# Patient Record
Sex: Female | Born: 1985 | Race: White | Hispanic: No | Marital: Married | Smoking: Former smoker
Health system: Southern US, Community
[De-identification: ages and names within clinical notes are randomized; demographics above are authoritative.]

## PROBLEM LIST (undated history)

## (undated) DIAGNOSIS — G43909 Migraine, unspecified, not intractable, without status migrainosus: Secondary | ICD-10-CM

## (undated) HISTORY — PX: TUBAL LIGATION: SHX77

---
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
## 2003-04-29 ENCOUNTER — Emergency Department (HOSPITAL_COMMUNITY): Admission: EM | Admit: 2003-04-29 | Discharge: 2003-04-30 | Payer: Self-pay | Admitting: Emergency Medicine

## 2005-06-28 ENCOUNTER — Emergency Department (HOSPITAL_COMMUNITY): Admission: EM | Admit: 2005-06-28 | Discharge: 2005-06-28 | Payer: Self-pay | Admitting: Emergency Medicine

## 2006-02-28 ENCOUNTER — Emergency Department: Payer: Self-pay | Admitting: Emergency Medicine

## 2006-03-12 ENCOUNTER — Emergency Department: Payer: Self-pay | Admitting: Emergency Medicine

## 2015-04-30 ENCOUNTER — Emergency Department (HOSPITAL_COMMUNITY)
Admission: EM | Admit: 2015-04-30 | Discharge: 2015-04-30 | Disposition: A | Discharge status: Home / Self Care | Payer: Self-pay | Attending: Dermatology | Admitting: Dermatology

## 2015-04-30 ENCOUNTER — Encounter (HOSPITAL_COMMUNITY): Payer: Self-pay

## 2015-04-30 DIAGNOSIS — Z720 Tobacco use: Secondary | ICD-10-CM | POA: Insufficient documentation

## 2015-04-30 DIAGNOSIS — Z5321 Procedure and treatment not carried out due to patient leaving prior to being seen by health care provider: Secondary | ICD-10-CM | POA: Insufficient documentation

## 2015-04-30 DIAGNOSIS — R51 Headache: Secondary | ICD-10-CM | POA: Insufficient documentation

## 2015-04-30 NOTE — ED Notes (Signed)
Unable to locate pt in all waiting areas x 3 separate times.

## 2015-04-30 NOTE — ED Notes (Signed)
Pt was at work when she states she started getting a headache, then vomited x 1 and has been nauseated since then.

## 2015-12-26 ENCOUNTER — Emergency Department (HOSPITAL_COMMUNITY)
Admission: EM | Admit: 2015-12-26 | Discharge: 2015-12-26 | Disposition: A | Discharge status: Home / Self Care | Payer: Self-pay | Attending: Emergency Medicine | Admitting: Emergency Medicine

## 2015-12-26 ENCOUNTER — Encounter (HOSPITAL_COMMUNITY): Payer: Self-pay | Admitting: Emergency Medicine

## 2015-12-26 DIAGNOSIS — J069 Acute upper respiratory infection, unspecified: Secondary | ICD-10-CM | POA: Insufficient documentation

## 2015-12-26 DIAGNOSIS — J029 Acute pharyngitis, unspecified: Secondary | ICD-10-CM

## 2015-12-26 DIAGNOSIS — F17210 Nicotine dependence, cigarettes, uncomplicated: Secondary | ICD-10-CM | POA: Insufficient documentation

## 2015-12-26 LAB — RAPID STREP SCREEN (MED CTR MEBANE ONLY): STREPTOCOCCUS, GROUP A SCREEN (DIRECT): NEGATIVE

## 2015-12-26 NOTE — ED Triage Notes (Signed)
Pt reports sore throat and bilateral ear pain since last night.  Pt reports that all 5 of her kids have has strep throat in the last week.

## 2015-12-26 NOTE — ED Notes (Signed)
Pt left prior to dc instructions.

## 2015-12-26 NOTE — ED Provider Notes (Signed)
[facility]-EMERGENCY DEPT Provider Note   CSN: [id number] Arrival date & time: 12/26/15  [id number]  By signing my name below, I, Placido SouLogan Joldersma, attest that this documentation has been prepared under the direction and in the presence of Ivery QualeHobson Brylei Pedley, PA-C. Electronically Signed: Placido SouLogan Joldersma, ED Scribe. 12/26/15. 9:25 AM.   History   Chief Complaint Chief Complaint  Patient presents with  . Sore Throat    HPI HPI Comments: Shannon Turner is a 30 y.o. female who presents to the Emergency Department complaining of constant, moderate, throbbing, left sided sore throat onset last night. Pt states her five children have been dx with strep throat in the past week. She reports associated ear pain which she states began just prior to her sore throat. Her throat pain worsens with swallowing. Pt denies a h/o cardiac issues or rheumatic fever. She denies fever, chills, SOB, difficulty swallowing and rash.   The history is provided by the patient. No language interpreter was used.  Sore Throat  This is a new problem. The current episode started 12 to 24 hours ago. The problem occurs constantly. The problem has not changed since onset.Pertinent negatives include no shortness of breath. The symptoms are aggravated by swallowing. Nothing relieves the symptoms. She has tried nothing for the symptoms. The treatment provided no relief.    History reviewed. No pertinent past medical history.  There are no active problems to display for this patient.   Past Surgical History:  Procedure Laterality Date  . TUBAL LIGATION      OB History    No data available     Home Medications    Prior to Admission medications   Not on File    Family History History reviewed. No pertinent family history.  Social History Social History  Substance Use Topics  . Smoking status: Current Every Day Smoker    Packs/day: 1.00    Types: Cigarettes  . Smokeless tobacco: Not on file  . Alcohol use No      Allergies   Review of patient's allergies indicates no known allergies.   Review of Systems Review of Systems  Constitutional: Negative for chills and fever.  HENT: Positive for ear pain and sore throat. Negative for trouble swallowing.   Respiratory: Negative for shortness of breath.   Skin: Negative for color change and rash.  All other systems reviewed and are negative.  Physical Exam Updated Vital Signs BP 130/76 (BP Location: Left Arm)   Pulse 82   Temp 98.2 F (36.8 C) (Oral)   Resp 18   Ht 5\' 7"  (1.702 m)   Wt 178 lb (80.7 kg)   LMP 12/16/2015 (Exact Date)   SpO2 99%   BMI 27.88 kg/m   Physical Exam  Constitutional: She is oriented to person, place, and time. She appears well-developed and well-nourished.  HENT:  Head: Normocephalic and atraumatic.  Mouth/Throat: Uvula is midline. Posterior oropharyngeal edema (mild) present. No oropharyngeal exudate.  Uvula midline. Minimal swelling noted. No exudates. Airway patent.   Eyes: EOM are normal.  Neck: Normal range of motion. Neck supple.  Cardiovascular: Normal rate, regular rhythm, normal heart sounds and intact distal pulses.   No murmur heard. Pulmonary/Chest: Effort normal and breath sounds normal. No respiratory distress. She has no wheezes. She has no rales.  LCTA  Abdominal: Soft.  Musculoskeletal: Normal range of motion.  Neurological: She is alert and oriented to person, place, and time.  Skin: Skin is warm and dry. No rash noted.  No  rash  Psychiatric: She has a normal mood and affect.  Nursing note and vitals reviewed.  ED Treatments / Results  Labs (all labs ordered are listed, but only abnormal results are displayed) Labs Reviewed  RAPID STREP SCREEN (NOT AT [facility])  CULTURE, GROUP A STREP [facility])    EKG  EKG Interpretation None       Radiology No results found.  Procedures Procedures  DIAGNOSTIC STUDIES: Oxygen Saturation is 99% on RA, normal by my interpretation.     COORDINATION OF CARE: 9:23 AM Discussed next steps with pt. Pt verbalized understanding and is agreeable with the plan.    Medications Ordered in ED Medications - No data to display   Initial Impression / Assessment and Plan / ED Course  I have reviewed the triage vital signs and the nursing notes.  Pertinent labs & imaging results that were available during my care of the patient were reviewed by me and considered in my medical decision making (see chart for details).  Clinical Course    *I have reviewed nursing notes, vital signs, and all appropriate lab and imaging results for this patient.**  **I personally performed the services described in this documentation, which was scribed in my presence. The recorded information has been reviewed and is accurate.* Final Clinical Impressions(s) / ED Diagnoses  Strep screen is negative. Discussed the need for saltwater gargles, tylenol, and increasing fluids. Chloraseptic spray may also be helpful.   Final diagnoses:  Sore throat  Upper respiratory tract infection, unspecified type    New Prescriptions New Prescriptions   No medications on file     Ivery Quale, PA-C 12/30/15 1240    Nelva Nay, MD 12/31/15 [phone]

## 2015-12-26 NOTE — Discharge Instructions (Signed)
Your strep test is negative. Please use salt water gargles three times daily. Use tylenol or ibuprofen for pain or fever. Wash hands frequently.

## 2015-12-28 LAB — CULTURE, GROUP A STREP (THRC)

## 2018-12-16 ENCOUNTER — Emergency Department (HOSPITAL_COMMUNITY)
Admission: EM | Admit: 2018-12-16 | Discharge: 2018-12-16 | Disposition: A | Discharge status: Home / Self Care | Payer: Managed Care, Other (non HMO) | Attending: Emergency Medicine | Admitting: Emergency Medicine

## 2018-12-16 ENCOUNTER — Other Ambulatory Visit: Payer: Self-pay

## 2018-12-16 ENCOUNTER — Emergency Department (HOSPITAL_COMMUNITY): Payer: Managed Care, Other (non HMO)

## 2018-12-16 DIAGNOSIS — F17210 Nicotine dependence, cigarettes, uncomplicated: Secondary | ICD-10-CM | POA: Insufficient documentation

## 2018-12-16 DIAGNOSIS — M79604 Pain in right leg: Secondary | ICD-10-CM | POA: Diagnosis present

## 2018-12-16 DIAGNOSIS — M5431 Sciatica, right side: Secondary | ICD-10-CM

## 2018-12-16 LAB — POC URINE PREG, ED: Preg Test, Ur: NEGATIVE

## 2018-12-16 MED ORDER — PREDNISONE 20 MG PO TABS
60.0000 mg | ORAL_TABLET | Freq: Once | ORAL | Status: AC
  Administered 2018-12-16: 60 mg via ORAL
  Filled 2018-12-16: qty 3

## 2018-12-16 MED ORDER — NAPROXEN 500 MG PO TABS
500.0000 mg | ORAL_TABLET | Freq: Once | ORAL | Status: AC
  Administered 2018-12-16: 500 mg via ORAL
  Filled 2018-12-16: qty 1

## 2018-12-16 MED ORDER — METHOCARBAMOL 500 MG PO TABS: 500.0000 mg | ORAL_TABLET | Freq: Two times a day (BID) | ORAL | 0 refills | Status: DC

## 2018-12-16 MED ORDER — LIDOCAINE 5 % EX PTCH
1.0000 | MEDICATED_PATCH | CUTANEOUS | Status: DC
  Administered 2018-12-16: 1 via TRANSDERMAL
  Filled 2018-12-16: qty 1

## 2018-12-16 MED ORDER — METHYLPREDNISOLONE 4 MG PO TBPK: ORAL_TABLET | ORAL | 0 refills | Status: DC

## 2018-12-16 MED ORDER — NAPROXEN 500 MG PO TABS: 500.0000 mg | ORAL_TABLET | Freq: Two times a day (BID) | ORAL | 0 refills | Status: DC

## 2018-12-16 NOTE — ED Triage Notes (Signed)
Pt POV from home.    Pt c/o pain in tail bone, radiating down leg.  Pain started Thursday last week.  Denies any recent falls or injuries.  Pain is constant aching, sporadically burns.

## 2018-12-16 NOTE — ED Notes (Signed)
Patient transported to X-ray 

## 2018-12-16 NOTE — Discharge Instructions (Signed)
You were seen here today for Back Pain: Low back pain is discomfort in the lower back that may be due to injuries to muscles and ligaments around the spine. Occasionally, it may be caused by a problem to a part of the spine called a disc. Your back pain should be treated with medicines listed below as well as back exercises and this back pain should get better over the next 2 weeks. Most patients get completely well in 4 weeks. It is important to know however, if you develop severe or worsening pain, low back pain with fever, numbness, weakness or inability to walk or urinate, you should return to the ER immediately.  Please follow up with your doctor this week for a recheck if still having symptoms.  HOME INSTRUCTIONS Self - care:  The application of heat can help soothe the pain.  Maintaining your daily activities, including walking (this is encouraged), as it will help you get better faster than just staying in bed. Do not life, push, pull anything more than 10 pounds for the next week. I am attaching back exercises that you can do at home to help facilitate your recovery.   Back Exercises - I have attached a handout on back exercises that can be done at home to help facilitate your recovery.   Medications are also useful to help with pain control.   Acetaminophen.  This medication is generally safe, and found over the counter. Take as directed for your age. You should not take more than 8 of the extra strength (500mg ) pills a day (max dose is 4000mg  total OVER one day)  Lidocaine Patch: Salon Pas lidocaine patches (blue and silver box) can be purchased over the counter and worn for 12 hours for local pain relief   Non steroidal anti inflammatory: This includes medications including Ibuprofen, naproxen and Mobic; These medications help both pain and swelling and are very useful in treating back pain.  They should be taken with food, as they can cause stomach upset, and more seriously, stomach  bleeding. Do not combine the medications.   Muscle relaxants:  These medications can help with muscle tightness that is a cause of lower back pain.  Most of these medications can cause drowsiness, and it is not safe to drive or use dangerous machinery while taking them. They are primarily helpful when taken at night before sleep.  Prednisone - This is an oral steroid.  This medication is best taken with food in the morning.  Please note that this medication can cause anxiety, mood swings, muscle fatigue, increased hunger, weight gain (sodium/fluid retention), poor sleep as well as other symptoms. If you are a diabetic, please monitor your blood sugars at home as this medication can increase your blood sugars. Call your pharmacist if you have any questions.  You will need to follow up with your primary healthcare provider  or the Orthopedist in 1-2 weeks for reassessment and persistent symptoms.  Be aware that if you develop new symptoms, such as a fever, leg weakness, difficulty with or loss of control of your urine or bowels, abdominal pain, or more severe pain, you will need to seek medical attention and/or return to the Emergency department. Additional Information:  Your vital signs today were: BP 112/67 (BP Location: Left Arm)    Pulse 73    Temp 98.3 F (36.8 C) (Oral)    Resp 18    Ht 5\' 9"  (1.753 m)    Wt 86.2 kg  SpO2 98%    BMI 28.06 kg/m  If your blood pressure (BP) was elevated above 135/85 this visit, please have this repeated by your doctor within one month. ---------------

## 2018-12-16 NOTE — ED Provider Notes (Signed)
[facility]-EMERGENCY DEPT Provider Note   CSN: [id number] Arrival date & time: 12/16/18  1036     History   Chief Complaint Chief Complaint  Patient presents with  . Leg Pain    HPI Shannon Turner is a 33 y.o. female.     Shannon Turner is a 33 y.o. female who is otherwise healthy, presents to the emergency department for evaluation of pain in the right side of her back that radiates into her buttock and down her leg.  Symptoms have been present for the past week.  She reports it feels like a burning pain with in the bone.  She has never had similar pain before.  She denies any recent falls or injury to the back.  She reports it is a constant aching with sporadic burning down the leg.  She denies any associated numbness weakness or tingling.  No loss of bowel or bladder control or saddle anesthesia.  No associated abdominal pain, dysuria, or fevers.  No history of cancer or IV drug use.  She has taken ibuprofen without much improvement has not tried anything else to treat the symptoms.     No past medical history on file.  There are no active problems to display for this patient.   Past Surgical History:  Procedure Laterality Date  . TUBAL LIGATION       OB History   No obstetric history on file.      Home Medications    Prior to Admission medications   Medication Sig Start Date End Date Taking? Authorizing Provider  methocarbamol (ROBAXIN) 500 MG tablet Take 1 tablet (500 mg total) by mouth 2 (two) times daily. 12/16/18   Dartha LodgeFord, Laurrie Toppin N, PA-C  methylPREDNISolone (MEDROL DOSEPAK) 4 MG TBPK tablet Take as directed 12/16/18   Dartha LodgeFord, Shiasia Porro N, PA-C  naproxen (NAPROSYN) 500 MG tablet Take 1 tablet (500 mg total) by mouth 2 (two) times daily. 12/16/18   Dartha LodgeFord, Edson Deridder N, PA-C    Family History No family history on file.  Social History Social History   Tobacco Use  . Smoking status: Current Every Day Smoker    Packs/day: 1.00    Types:  Cigarettes  Substance Use Topics  . Alcohol use: No  . Drug use: No     Allergies   Patient has no known allergies.   Review of Systems Review of Systems  Constitutional: Negative for chills and fever.  HENT: Negative.   Respiratory: Negative for shortness of breath.   Cardiovascular: Negative for chest pain.  Gastrointestinal: Negative for abdominal pain, constipation, diarrhea, nausea and vomiting.  Genitourinary: Negative for dysuria, flank pain, frequency and hematuria.  Musculoskeletal: Positive for back pain. Negative for arthralgias, gait problem, joint swelling, myalgias and neck pain.  Skin: Negative for color change, rash and wound.  Neurological: Negative for weakness and numbness.     Physical Exam Updated Vital Signs BP 112/67 (BP Location: Left Arm)   Pulse 73   Temp 98.3 F (36.8 C) (Oral)   Resp 18   Ht 5\' 9"  (1.753 m)   Wt 86.2 kg   SpO2 98%   BMI 28.06 kg/m   Physical Exam Vitals signs and nursing note reviewed.  Constitutional:      General: She is not in acute distress.    Appearance: Normal appearance. She is well-developed and normal weight. She is not diaphoretic.  HENT:     Head: Atraumatic.  Eyes:     General:  Right eye: No discharge.        Left eye: No discharge.  Neck:     Musculoskeletal: Neck supple.  Cardiovascular:     Pulses:          Radial pulses are 2+ on the right side and 2+ on the left side.       Dorsalis pedis pulses are 2+ on the right side and 2+ on the left side.       Posterior tibial pulses are 2+ on the right side and 2+ on the left side.  Pulmonary:     Effort: Pulmonary effort is normal. No respiratory distress.  Abdominal:     General: Bowel sounds are normal. There is no distension.     Palpations: Abdomen is soft. There is no mass.     Tenderness: There is no abdominal tenderness. There is no guarding.     Comments: Abdomen soft, nondistended, nontender to palpation in all quadrants without  guarding or peritoneal signs, no CVA tenderness bilaterally  Musculoskeletal:     Comments: Tenderness to palpation over right low back pain.  Pain made worse with range of motion of the lower extremities, positive straight leg raise on the right.  Skin:    General: Skin is warm and dry.     Capillary Refill: Capillary refill takes less than 2 seconds.  Neurological:     Mental Status: She is alert and oriented to person, place, and time.     Comments: Alert, clear speech, following commands. Moving all extremities without difficulty. Bilateral lower extremities with 5/5 strength in proximal and distal muscle groups and with dorsi and plantar flexion. Sensation intact in bilateral lower extremities. 2+ patellar DTRs bilaterally. Ambulatory with steady gait  Psychiatric:        Mood and Affect: Mood normal.        Behavior: Behavior normal.      ED Treatments / Results  Labs (all labs ordered are listed, but only abnormal results are displayed) Labs Reviewed  POC URINE PREG, ED    EKG None  Radiology Dg Lumbar Spine Complete  Result Date: 12/16/2018 CLINICAL DATA:  Worsening low back pain that radiates down rt leg since last Thursday, no known trauma EXAM: LUMBAR SPINE - COMPLETE 4+ VIEW COMPARISON:  None. FINDINGS: Normal alignment. Vertebral body heights are maintained without evidence of fracture. Intervertebral disc spaces are maintained. Intact posterior elements. Visualized portions of the pelvis are unremarkable. SI joints are open. Nonobstructive bowel gas pattern. IMPRESSION: Negative lumbar spine radiographs. Electronically Signed   By: Emmaline Kluver M.D.   On: 12/16/2018 12:54   Dg Hip Unilat W Or Wo Pelvis 2-3 Views Right  Result Date: 12/16/2018 CLINICAL DATA:  Worsening low back pain that radiates down rt leg since last Thursday, no known trauma EXAM: DG HIP (WITH OR WITHOUT PELVIS) 2-3V RIGHT COMPARISON:  None. FINDINGS: There is no evidence of right hip  fracture or dislocation. There is no evidence of arthropathy or other focal bone abnormality. Regional soft tissues are unremarkable. IMPRESSION: Negative right hip radiographs. Electronically Signed   By: Emmaline Kluver M.D.   On: 12/16/2018 12:55    Procedures Procedures (including critical care time)  Medications Ordered in ED Medications  lidocaine (LIDODERM) 5 % 1 patch (1 patch Transdermal Patch Applied 12/16/18 1216)  naproxen (NAPROSYN) tablet 500 mg (500 mg Oral Given 12/16/18 1215)  predniSONE (DELTASONE) tablet 60 mg (60 mg Oral Given 12/16/18 1215)     Initial Impression /  Assessment and Plan / ED Course  I have reviewed the triage vital signs and the nursing notes.  Pertinent labs & imaging results that were available during my care of the patient were reviewed by me and considered in my medical decision making (see chart for details).  Normal neurological exam, no evidence of urinary incontinence or retention, pain is consistently reproducible. There is no evidence of AAA or concern for dissection at this time.   Patient can walk but states is painful.  No loss of bowel or bladder control.  No concern for cauda equina.  No fever, night sweats, weight loss, h/o cancer, IVDU.  X-rays are unremarkable. Pain treated here in the department with adequate improvement. RICE protocol and pain medicine indicated and discussed with patient. I have also discussed reasons to return immediately to the ER.  Patient expresses understanding and agrees with plan.    Final Clinical Impressions(s) / ED Diagnoses   Final diagnoses:  Sciatica of right side    ED Discharge Orders         Ordered    naproxen (NAPROSYN) 500 MG tablet  2 times daily     12/16/18 1324    methocarbamol (ROBAXIN) 500 MG tablet  2 times daily     12/16/18 1324    methylPREDNISolone (MEDROL DOSEPAK) 4 MG TBPK tablet     12/16/18 1324           Jacqlyn Larsen, [place] 12/16/18 1454    Varney Biles, MD  12/16/18 1610

## 2019-01-08 DIAGNOSIS — M5430 Sciatica, unspecified side: Secondary | ICD-10-CM | POA: Insufficient documentation

## 2019-01-08 DIAGNOSIS — F17200 Nicotine dependence, unspecified, uncomplicated: Secondary | ICD-10-CM | POA: Insufficient documentation

## 2019-01-08 DIAGNOSIS — F331 Major depressive disorder, recurrent, moderate: Secondary | ICD-10-CM | POA: Insufficient documentation

## 2019-04-06 ENCOUNTER — Encounter (HOSPITAL_COMMUNITY): Payer: Self-pay

## 2019-04-06 ENCOUNTER — Emergency Department (HOSPITAL_COMMUNITY)
Admission: EM | Admit: 2019-04-06 | Discharge: 2019-04-06 | Disposition: A | Discharge status: Home / Self Care | Payer: Managed Care, Other (non HMO) | Attending: Emergency Medicine | Admitting: Emergency Medicine

## 2019-04-06 ENCOUNTER — Other Ambulatory Visit: Payer: Self-pay

## 2019-04-06 DIAGNOSIS — K0889 Other specified disorders of teeth and supporting structures: Secondary | ICD-10-CM

## 2019-04-06 DIAGNOSIS — Z87891 Personal history of nicotine dependence: Secondary | ICD-10-CM | POA: Insufficient documentation

## 2019-04-06 DIAGNOSIS — K029 Dental caries, unspecified: Secondary | ICD-10-CM | POA: Diagnosis not present

## 2019-04-06 MED ORDER — NAPROXEN 500 MG PO TABS: 500.0000 mg | ORAL_TABLET | Freq: Two times a day (BID) | ORAL | 0 refills | Status: DC

## 2019-04-06 MED ORDER — PENICILLIN V POTASSIUM 500 MG PO TABS: 500.0000 mg | ORAL_TABLET | Freq: Four times a day (QID) | ORAL | 0 refills | Status: AC

## 2019-04-06 NOTE — ED Triage Notes (Signed)
patient c/o right upper dental pain x 2 days.

## 2019-04-06 NOTE — ED Provider Notes (Signed)
[facility] Emergency Department Provider Note MRN:  [id number]  Arrival date & time: 04/06/19     Chief Complaint   Dental Pain   History of Present Illness   Shannon Turner is a 34 y.o. year-old female with a history of tubal ligation presenting to the ED with chief complaint of dental pain.  2 or 3 days of pain to the right upper molar, gradual onset, progressively worsening.  Currently 7 out of 10, described as an ache.  Denies fever, no other complaints.  Unable to see her dentist until next month.  Review of Systems  A problem-focused ROS was performed. Positive for tooth pain.  Patient denies fever.  Patient's Health History   History reviewed. No pertinent past medical history.  Past Surgical History:  Procedure Laterality Date  . TUBAL LIGATION      Family History  Family history unknown: Yes    Social History   Socioeconomic History  . Marital status: Single    Spouse name: Not on file  . Number of children: Not on file  . Years of education: Not on file  . Highest education level: Not on file  Occupational History  . Not on file  Tobacco Use  . Smoking status: Former Smoker    Packs/day: 1.00    Types: Cigarettes  . Smokeless tobacco: Never Used  Substance and Sexual Activity  . Alcohol use: No  . Drug use: No  . Sexual activity: Not on file  Other Topics Concern  . Not on file  Social History Narrative  . Not on file   Social Determinants of Health   Financial Resource Strain:   . Difficulty of Paying Living Expenses: Not on file  Food Insecurity:   . Worried About Charity fundraiser in the Last Year: Not on file  . Ran Out of Food in the Last Year: Not on file  Transportation Needs:   . Lack of Transportation (Medical): Not on file  . Lack of Transportation (Non-Medical): Not on file  Physical Activity:   . Days of Exercise per Week: Not on file  . Minutes of Exercise per Session: Not on file  Stress:   .  Feeling of Stress : Not on file  Social Connections:   . Frequency of Communication with Friends and Family: Not on file  . Frequency of Social Gatherings with Friends and Family: Not on file  . Attends Religious Services: Not on file  . Active Member of Clubs or Organizations: Not on file  . Attends Archivist Meetings: Not on file  . Marital Status: Not on file  Intimate Partner Violence:   . Fear of Current or Ex-Partner: Not on file  . Emotionally Abused: Not on file  . Physically Abused: Not on file  . Sexually Abused: Not on file     Physical Exam   Vitals:   04/06/19 0933  BP: 135/87  Pulse: 69  Resp: 20  Temp: 99.1 F (37.3 C)    CONSTITUTIONAL: Well-appearing, NAD NEURO:  Alert and oriented x 3, no focal deficits EYES:  eyes equal and reactive ENT/NECK:  no LAD, no JVD; multiple caries and tenderness palpation to tooth #1, minimal surrounding edema CARDIO: Regular rate, well-perfused, normal S1 and S2 PULM:  CTAB no wheezing or rhonchi GI/GU:  normal bowel sounds, non-distended, non-tender MSK/SPINE:  No gross deformities, no edema SKIN:  no rash, atraumatic PSYCH:  Appropriate speech and behavior  *Additional and/or pertinent  findings included in MDM below  Diagnostic and Interventional Summary    EKG Interpretation  Date/Time:    Ventricular Rate:    PR Interval:    QRS Duration:   QT Interval:    QTC Calculation:   R Axis:     Text Interpretation:        Cardiac Monitoring Interpretation:  Labs Reviewed - No data to display  No orders to display    Medications - No data to display   Procedures  /  Critical Care Procedures  ED Course and Medical Decision Making  I have reviewed the triage vital signs, the nursing notes, and pertinent available records from the EMR.  Pertinent labs & imaging results that were available during my care of the patient were reviewed by me and considered in my medical decision making (see below for  details).     Uncomplicated tooth infection, no gingival abscess, normal vital signs, no fever, appropriate for antibiotics and dental follow-up.    Elmer Sow. Pilar Plate, MD [facility] Emergency Medicine [facility] [email or web address]  Final Clinical Impressions(s) / ED Diagnoses     ICD-10-CM   1. Pain, dental  K08.89     ED Discharge Orders         Ordered    penicillin v potassium (VEETID) 500 MG tablet  4 times daily     04/06/19 0951    naproxen (NAPROSYN) 500 MG tablet  2 times daily     04/06/19 0814           Discharge Instructions Discussed with and Provided to Patient:     Discharge Instructions     You were evaluated in the Emergency Department and after careful evaluation, we did not find any emergent condition requiring admission or further testing in the hospital.  Your tooth is infected with multiple cavities and likely needs to be extracted.  Please follow-up with a dentist for repeat evaluation.  Until then, please take the antibiotics and anti-inflammatories as directed.  Please return to the Emergency Department if you experience any worsening of your condition.  We encourage you to follow up with a primary care provider.  Thank you for allowing [place] to be a part of your care.      Sabas Sous, MD 04/06/19 [phone]

## 2019-04-06 NOTE — Discharge Instructions (Addendum)
You were evaluated in the Emergency Department and after careful evaluation, we did not find any emergent condition requiring admission or further testing in the hospital.  Your tooth is infected with multiple cavities and likely needs to be extracted.  Please follow-up with a dentist for repeat evaluation.  Until then, please take the antibiotics and anti-inflammatories as directed.  Please return to the Emergency Department if you experience any worsening of your condition.  We encourage you to follow up with a primary care provider.  Thank you for allowing [place] to be a part of your care.

## 2019-06-27 ENCOUNTER — Emergency Department (HOSPITAL_COMMUNITY)
Admission: EM | Admit: 2019-06-27 | Discharge: 2019-06-27 | Discharge status: Against Medical Advice | Payer: Managed Care, Other (non HMO)

## 2019-06-27 NOTE — ED Notes (Signed)
No answer for triage x1 

## 2019-06-27 NOTE — ED Notes (Signed)
No answer for triage x2 

## 2019-06-27 NOTE — ED Notes (Signed)
No answer in WR x 3 for triage

## 2019-10-06 ENCOUNTER — Ambulatory Visit: Admit: 2019-10-06 | Discharge status: Against Medical Advice | Payer: MEDICAID

## 2019-10-06 ENCOUNTER — Emergency Department (HOSPITAL_COMMUNITY)
Admission: EM | Admit: 2019-10-06 | Discharge: 2019-10-06 | Disposition: A | Discharge status: Home / Self Care | Payer: No Typology Code available for payment source | Attending: Emergency Medicine | Admitting: Emergency Medicine

## 2019-10-06 ENCOUNTER — Encounter (HOSPITAL_COMMUNITY): Payer: Self-pay | Admitting: Emergency Medicine

## 2019-10-06 ENCOUNTER — Other Ambulatory Visit: Payer: Self-pay

## 2019-10-06 DIAGNOSIS — R197 Diarrhea, unspecified: Secondary | ICD-10-CM | POA: Diagnosis not present

## 2019-10-06 DIAGNOSIS — Z20822 Contact with and (suspected) exposure to covid-19: Secondary | ICD-10-CM | POA: Diagnosis not present

## 2019-10-06 DIAGNOSIS — Z5321 Procedure and treatment not carried out due to patient leaving prior to being seen by health care provider: Secondary | ICD-10-CM | POA: Diagnosis not present

## 2019-10-06 DIAGNOSIS — G43909 Migraine, unspecified, not intractable, without status migrainosus: Secondary | ICD-10-CM | POA: Insufficient documentation

## 2019-10-06 DIAGNOSIS — R1110 Vomiting, unspecified: Secondary | ICD-10-CM | POA: Insufficient documentation

## 2019-10-06 HISTORY — DX: Migraine, unspecified, not intractable, without status migrainosus: G43.909

## 2019-10-06 LAB — COMPREHENSIVE METABOLIC PANEL
ALT: 14 U/L (ref 0–44)
AST: 15 U/L (ref 15–41)
Albumin: 4.1 g/dL (ref 3.5–5.0)
Alkaline Phosphatase: 74 U/L (ref 38–126)
Anion gap: 12 (ref 5–15)
BUN: 9 mg/dL (ref 6–20)
CO2: 24 mmol/L (ref 22–32)
Calcium: 10.1 mg/dL (ref 8.9–10.3)
Chloride: 101 mmol/L (ref 98–111)
Creatinine, Ser: 0.74 mg/dL (ref 0.44–1.00)
GFR calc Af Amer: >60 mL/min (ref >60)
GFR calc non Af Amer: >60 mL/min (ref >60)
Glucose, Bld: 98 mg/dL (ref 70–99)
Potassium: 3.8 mmol/L (ref 3.5–5.1)
Sodium: 137 mmol/L (ref 135–145)
Total Bilirubin: 0.9 mg/dL (ref 0.3–1.2)
Total Protein: 6.8 g/dL (ref 6.5–8.1)

## 2019-10-06 LAB — CBC
HCT: 41.2 % (ref 36.0–46.0)
Hemoglobin: 13.4 g/dL (ref 12.0–15.0)
MCH: 28.4 pg (ref 26.0–34.0)
MCHC: 32.5 g/dL (ref 30.0–36.0)
MCV: 87.3 fL (ref 80.0–100.0)
Platelets: 298 10*3/uL (ref 150–400)
RBC: 4.72 MIL/uL (ref 3.87–5.11)
RDW: 11.9 % (ref 11.5–15.5)
WBC: 5.6 10*3/uL (ref 4.0–10.5)
nRBC: 0 % (ref 0.0–0.2)

## 2019-10-06 LAB — URINALYSIS, ROUTINE W REFLEX MICROSCOPIC
Bilirubin Urine: NEGATIVE
Glucose, UA: NEGATIVE mg/dL
Ketones, ur: NEGATIVE mg/dL
Leukocytes,Ua: NEGATIVE
Nitrite: NEGATIVE
Protein, ur: NEGATIVE mg/dL
Specific Gravity, Urine: 1.019 (ref 1.005–1.030)
pH: 5 (ref 5.0–8.0)

## 2019-10-06 LAB — I-STAT BETA HCG BLOOD, ED (MC, WL, AP ONLY): I-stat hCG, quantitative: <5 m[IU]/mL (ref <5)

## 2019-10-06 LAB — SARS CORONAVIRUS 2 BY RT PCR (HOSPITAL ORDER, PERFORMED IN ~~LOC~~ HOSPITAL LAB): SARS Coronavirus 2: NEGATIVE

## 2019-10-06 LAB — LIPASE, BLOOD: Lipase: 26 U/L (ref 11–51)

## 2019-10-06 MED ORDER — SODIUM CHLORIDE 0.9% FLUSH: 3.0000 mL | Freq: Once | INTRAVENOUS | Status: DC

## 2019-10-06 NOTE — ED Notes (Signed)
Pt stated she was leaving AMA 

## 2019-10-06 NOTE — ED Triage Notes (Signed)
Pt c/o migraine headache x's 3 days.  St's she started having elevated temp with vomiting and diarrhea yesterday.   Last tylenol today at 2:30.

## 2019-10-22 ENCOUNTER — Emergency Department (HOSPITAL_COMMUNITY)
Admission: EM | Admit: 2019-10-22 | Discharge: 2019-10-23 | Disposition: A | Discharge status: Home / Self Care | Payer: No Typology Code available for payment source | Attending: Emergency Medicine | Admitting: Emergency Medicine

## 2019-10-22 ENCOUNTER — Other Ambulatory Visit: Payer: Self-pay

## 2019-10-22 ENCOUNTER — Emergency Department (HOSPITAL_COMMUNITY): Payer: No Typology Code available for payment source

## 2019-10-22 DIAGNOSIS — Y999 Unspecified external cause status: Secondary | ICD-10-CM | POA: Insufficient documentation

## 2019-10-22 DIAGNOSIS — Y939 Activity, unspecified: Secondary | ICD-10-CM | POA: Insufficient documentation

## 2019-10-22 DIAGNOSIS — R0789 Other chest pain: Secondary | ICD-10-CM | POA: Diagnosis present

## 2019-10-22 DIAGNOSIS — X58XXXA Exposure to other specified factors, initial encounter: Secondary | ICD-10-CM | POA: Insufficient documentation

## 2019-10-22 DIAGNOSIS — R0602 Shortness of breath: Secondary | ICD-10-CM | POA: Insufficient documentation

## 2019-10-22 DIAGNOSIS — Y929 Unspecified place or not applicable: Secondary | ICD-10-CM | POA: Insufficient documentation

## 2019-10-22 DIAGNOSIS — T148XXA Other injury of unspecified body region, initial encounter: Secondary | ICD-10-CM | POA: Insufficient documentation

## 2019-10-22 DIAGNOSIS — Z87891 Personal history of nicotine dependence: Secondary | ICD-10-CM | POA: Insufficient documentation

## 2019-10-22 LAB — CBC
HCT: 42 % (ref 36.0–46.0)
Hemoglobin: 13.7 g/dL (ref 12.0–15.0)
MCH: 27.8 pg (ref 26.0–34.0)
MCHC: 32.6 g/dL (ref 30.0–36.0)
MCV: 85.4 fL (ref 80.0–100.0)
Platelets: 255 10*3/uL (ref 150–400)
RBC: 4.92 MIL/uL (ref 3.87–5.11)
RDW: 11.9 % (ref 11.5–15.5)
WBC: 5.5 10*3/uL (ref 4.0–10.5)
nRBC: 0 % (ref 0.0–0.2)

## 2019-10-22 LAB — BASIC METABOLIC PANEL
Anion gap: 11 (ref 5–15)
BUN: 10 mg/dL (ref 6–20)
CO2: 21 mmol/L — ABNORMAL LOW (ref 22–32)
Calcium: 9.5 mg/dL (ref 8.9–10.3)
Chloride: 106 mmol/L (ref 98–111)
Creatinine, Ser: 0.69 mg/dL (ref 0.44–1.00)
GFR calc Af Amer: >60 mL/min (ref >60)
GFR calc non Af Amer: >60 mL/min (ref >60)
Glucose, Bld: 120 mg/dL — ABNORMAL HIGH (ref 70–99)
Potassium: 3.7 mmol/L (ref 3.5–5.1)
Sodium: 138 mmol/L (ref 135–145)

## 2019-10-22 LAB — I-STAT BETA HCG BLOOD, ED (MC, WL, AP ONLY): I-stat hCG, quantitative: <5 m[IU]/mL (ref <5)

## 2019-10-22 LAB — TROPONIN I (HIGH SENSITIVITY)
Troponin I (High Sensitivity): 2 ng/L (ref <18)
Troponin I (High Sensitivity): 3 ng/L (ref <18)

## 2019-10-22 NOTE — ED Triage Notes (Signed)
Pt reports weakness when trying to use her L arm x 2 months. Additionally this morning began having episodes of palpitations and chest pain followed by shortness of breath and feeling lightheaded. Pt sts the episodes happen every 20 mins or so.

## 2019-10-23 NOTE — ED Notes (Signed)
Pt verbalized understanding of d/c instructions, follow up and medications. Pt ambulatory to WR with steady gait. NAD.  

## 2019-10-23 NOTE — ED Provider Notes (Signed)
[facility] EMERGENCY DEPARTMENT Provider Note  CSN: [id number] Arrival date & time: 10/22/19 1241  Chief Complaint(s) Chest Pain and Shortness of Breath  HPI Shannon Turner is a 34 y.o. female   CC: chest pain  Onset/Duration: 1 day Timing: intermittent and sporadic.  Occurring approximately every 20 to 30 minutes and lasting 2 to 3 seconds at a time. Location: Left upper chest Quality: Pressure Severity: Moderate to severe Modifying Factors:  Improved by: Self resolving  Worsened by: Nothing Associated Signs/Symptoms:  Pertinent (+): Shortness of breath only when the pain was severe.    Pertinent (-): Fevers, chills, nausea, vomiting, abdominal pain, trauma. Context:   Patient has been having left arm paresthesias for the past 2 months.  HPI  Past Medical History Past Medical History:  Diagnosis Date  . Migraines    There are no problems to display for this patient.  Home Medication(s) Prior to Admission medications   Medication Sig Start Date End Date Taking? Authorizing Provider  methocarbamol (ROBAXIN) 500 MG tablet Take 1 tablet (500 mg total) by mouth 2 (two) times daily. 12/16/18   Dartha Lodge, PA-C  methylPREDNISolone (MEDROL DOSEPAK) 4 MG TBPK tablet Take as directed 12/16/18   Dartha Lodge, PA-C  naproxen (NAPROSYN) 500 MG tablet Take 1 tablet (500 mg total) by mouth 2 (two) times daily. 12/16/18   Dartha Lodge, PA-C  naproxen (NAPROSYN) 500 MG tablet Take 1 tablet (500 mg total) by mouth 2 (two) times daily. 04/06/19   Sabas Sous, MD                                                                                                                                    Past Surgical History Past Surgical History:  Procedure Laterality Date  . TUBAL LIGATION     Family History Family History  Family history unknown: Yes    Social History Social History   Tobacco Use  . Smoking status: Former Smoker    Packs/day: 1.00     Types: Cigarettes  . Smokeless tobacco: Never Used  Vaping Use  . Vaping Use: Never used  Substance Use Topics  . Alcohol use: No  . Drug use: No   Allergies Patient has no known allergies.  Review of Systems Review of Systems All other systems are reviewed and are negative for acute change except as noted in the HPI  Physical Exam Vital Signs  I have reviewed the triage vital signs BP 116/75   Pulse 92   Temp 98.6 F (37 C) (Oral)   Resp 18   LMP 10/06/2019 (Exact Date)   SpO2 100%   Physical Exam Vitals reviewed.  Constitutional:      General: She is not in acute distress.    Appearance: She is well-developed. She is not diaphoretic.  HENT:     Head: Normocephalic and atraumatic.     Nose:  Nose normal.  Eyes:     General: No scleral icterus.       Right eye: No discharge.        Left eye: No discharge.     Conjunctiva/sclera: Conjunctivae normal.     Pupils: Pupils are equal, round, and reactive to light.  Cardiovascular:     Rate and Rhythm: Normal rate and regular rhythm.     Heart sounds: No murmur heard.  No friction rub. No gallop.   Pulmonary:     Effort: Pulmonary effort is normal. No respiratory distress.     Breath sounds: Normal breath sounds. No stridor. No rales.  Chest:     Chest wall: Tenderness present.    Abdominal:     General: There is no distension.     Palpations: Abdomen is soft.     Tenderness: There is no abdominal tenderness.  Musculoskeletal:     Cervical back: Normal range of motion and neck supple.     Thoracic back: Spasms and tenderness present.       Back:     Comments: Palpating the parascapular muscles reproduces patient's chest pain.   Skin:    General: Skin is warm and dry.     Findings: No erythema or rash.  Neurological:     Mental Status: She is alert and oriented to person, place, and time.     ED Results and Treatments Labs (all labs ordered are listed, but only abnormal results are displayed) Labs  Reviewed  BASIC METABOLIC PANEL - Abnormal; Notable for the following components:      Result Value   CO2 21 (*)    Glucose, Bld 120 (*)    All other components within normal limits  CBC  I-STAT BETA HCG BLOOD, ED ([facility], [facility], [facility] ONLY)  TROPONIN I (HIGH SENSITIVITY)  TROPONIN I (HIGH SENSITIVITY)                                                                                                                         EKG  EKG Interpretation  Date/Time:  Thursday October 22 2019 13:02:27 EDT Ventricular Rate:  72 PR Interval:  148 QRS Duration: 76 QT Interval:  376 QTC Calculation: 411 R Axis:   77 Text Interpretation: Normal sinus rhythm with sinus arrhythmia Normal ECG NO STEMI. No old tracing to compare Confirmed by Drema Pry [phone]) on 10/22/2019 11:44:44 PM      Radiology DG Chest 2 View  Result Date: 10/22/2019 CLINICAL DATA:  Chest pain. EXAM: CHEST - 2 VIEW COMPARISON:  No prior. FINDINGS: Mediastinum and hilar structures normal. Lungs are clear. No pleural effusion or pneumothorax. Heart size normal. Thoracic spine scoliosis and degenerative change. IMPRESSION: No acute cardiopulmonary disease. Electronically Signed   By: Maisie Fus  Register   On: 10/22/2019 13:17    Pertinent labs & imaging results that were available during my care of the patient were reviewed by me and considered in my medical decision making (see chart  for details).  Medications Ordered in ED Medications - No data to display                                                                                                                                  Procedures Procedures  (including critical care time)  Medical Decision Making / ED Course I have reviewed the nursing notes for this encounter and the patient's prior records (if available in EHR or on provided paperwork).   Shannon Turner was evaluated in Emergency Department on 10/23/2019 for the symptoms described in the history of present  illness. She was evaluated in the context of the global COVID-19 pandemic, which necessitated consideration that the patient might be at risk for infection with the SARS-CoV-2 virus that causes COVID-19. Institutional protocols and algorithms that pertain to the evaluation of patients at risk for COVID-19 are in a state of rapid change based on information released by regulatory bodies including the CDC and federal and state organizations. These policies and algorithms were followed during the patient's care in the ED.  Patient presents with left-sided chest pain. Most consistent with muscle strain/spasm of the left shoulder girdle muscles.  Low suspicion for ACS.  EKG without acute ischemic changes or evidence of pericarditis.  Heart score of 2.  Negative troponins x2.  Low pretest probability for pulmonary embolism.  Presentation not classic for aortic dissection or esophageal perforation.  Chest x-ray without evidence suggestive of pneumonia, pneumothorax, pneumomediastinum.  No abnormal contour of the mediastinum to suggest dissection. No evidence of acute injuries.       Final Clinical Impression(s) / ED Diagnoses Final diagnoses:  Left-sided chest wall pain  Muscle strain    The patient appears reasonably screened and/or stabilized for discharge and I doubt any other medical condition or other [place] requiring further screening, evaluation, or treatment in the ED at this time prior to discharge. Safe for discharge with strict return precautions.  Disposition: Discharge  Condition: Good  I have discussed the results, Dx and Tx plan with the patient/family who expressed understanding and agree(s) with the plan. Discharge instructions discussed at length. The patient/family was given strict return precautions who verbalized understanding of the instructions. No further questions at time of discharge.    ED Discharge Orders    None       Follow Up: Morrell Riddle, PA-C [address] [place] [address] [phone]  Schedule an appointment as soon as possible for a visit  As needed     This chart was dictated using voice recognition software.  Despite best efforts to proofread,  errors can occur which can change the documentation meaning.   Nira Conn, MD 10/23/19 0110

## 2019-10-23 NOTE — Discharge Instructions (Addendum)
You may use over-the-counter Motrin (Ibuprofen), Acetaminophen (Tylenol), topical muscle creams such as SalonPas, Icy Hot, Bengay, etc. Please stretch, apply heat, and have massage therapy for additional assistance. ° °

## 2019-12-17 DIAGNOSIS — F418 Other specified anxiety disorders: Secondary | ICD-10-CM | POA: Insufficient documentation

## 2019-12-21 DIAGNOSIS — N879 Dysplasia of cervix uteri, unspecified: Secondary | ICD-10-CM | POA: Insufficient documentation

## 2020-05-02 DIAGNOSIS — Z90710 Acquired absence of both cervix and uterus: Secondary | ICD-10-CM | POA: Insufficient documentation

## 2021-03-23 IMAGING — CR DG LUMBAR SPINE COMPLETE 4+V
5 series · 5 of 5 positions shown · non-contrast
Comparison: None.

CLINICAL DATA: Worsening low back pain that radiates down rt leg
since [REDACTED], no known trauma

EXAM:
LUMBAR SPINE - COMPLETE 4+ VIEW

[t lumbar spine ap]
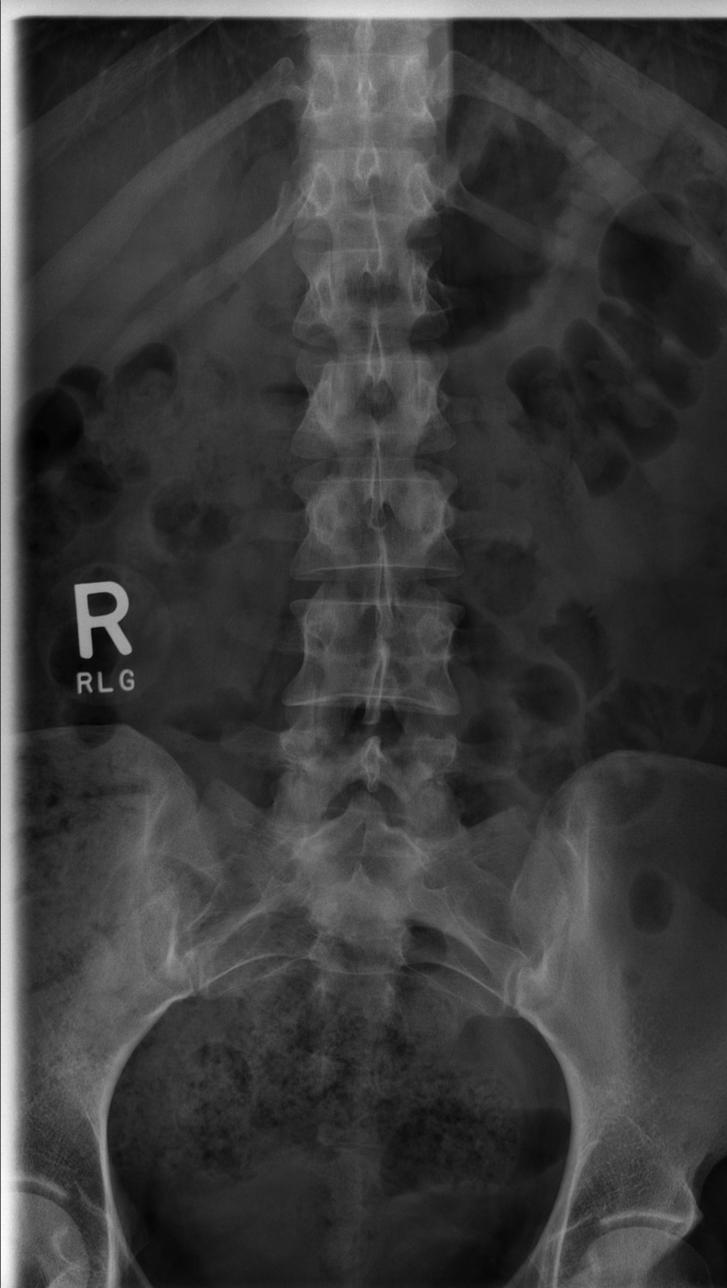

[t lumbar spine obl (1 of 2)]
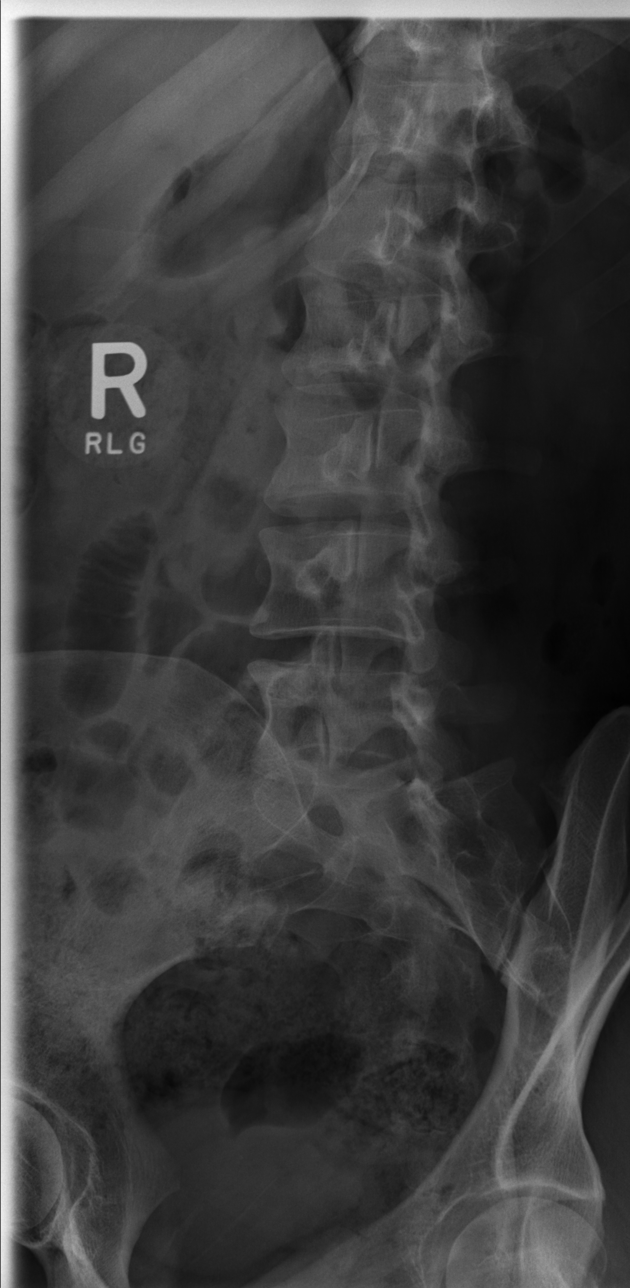

[t lumbar spine obl (2 of 2)]
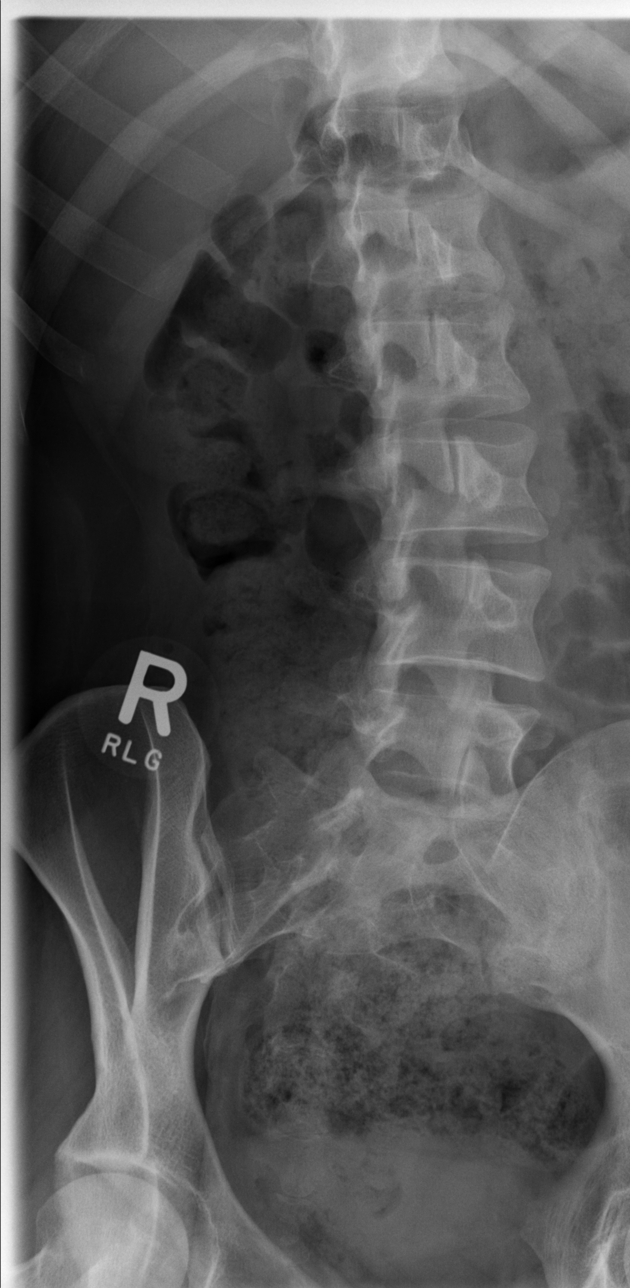

[t lumbar spine lat]
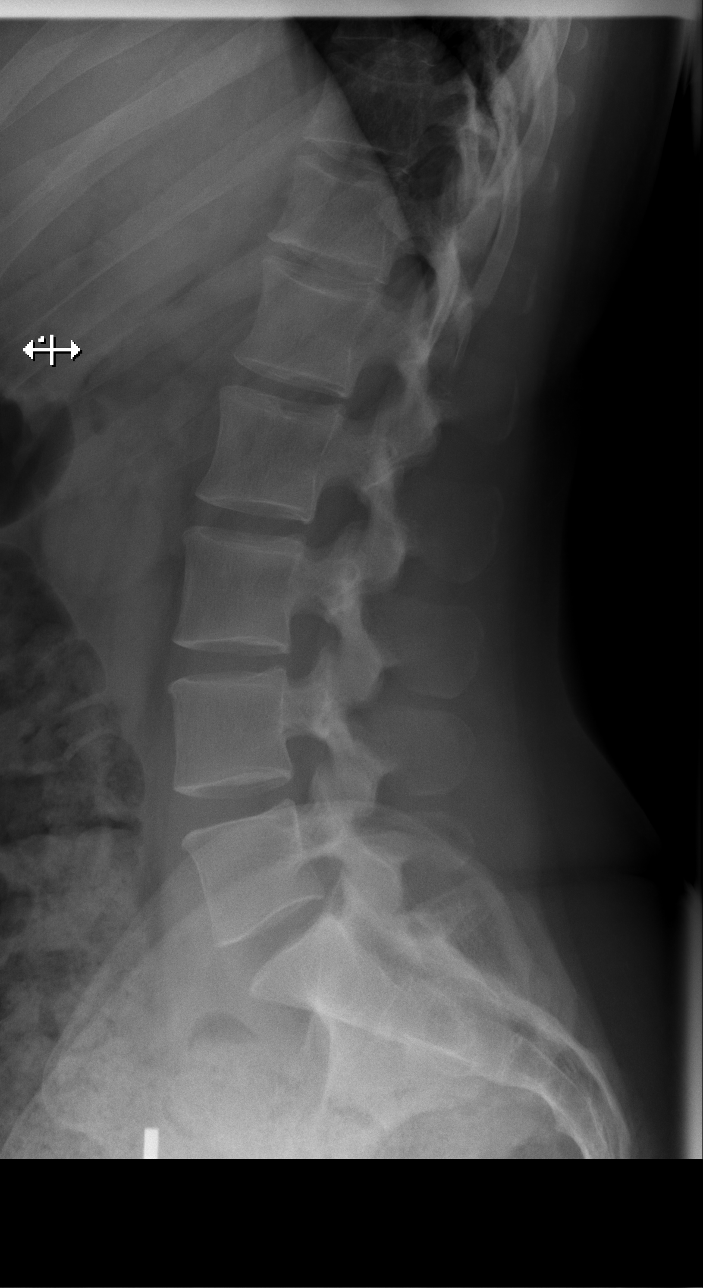

[t lumbar l-5 s-1 spot]
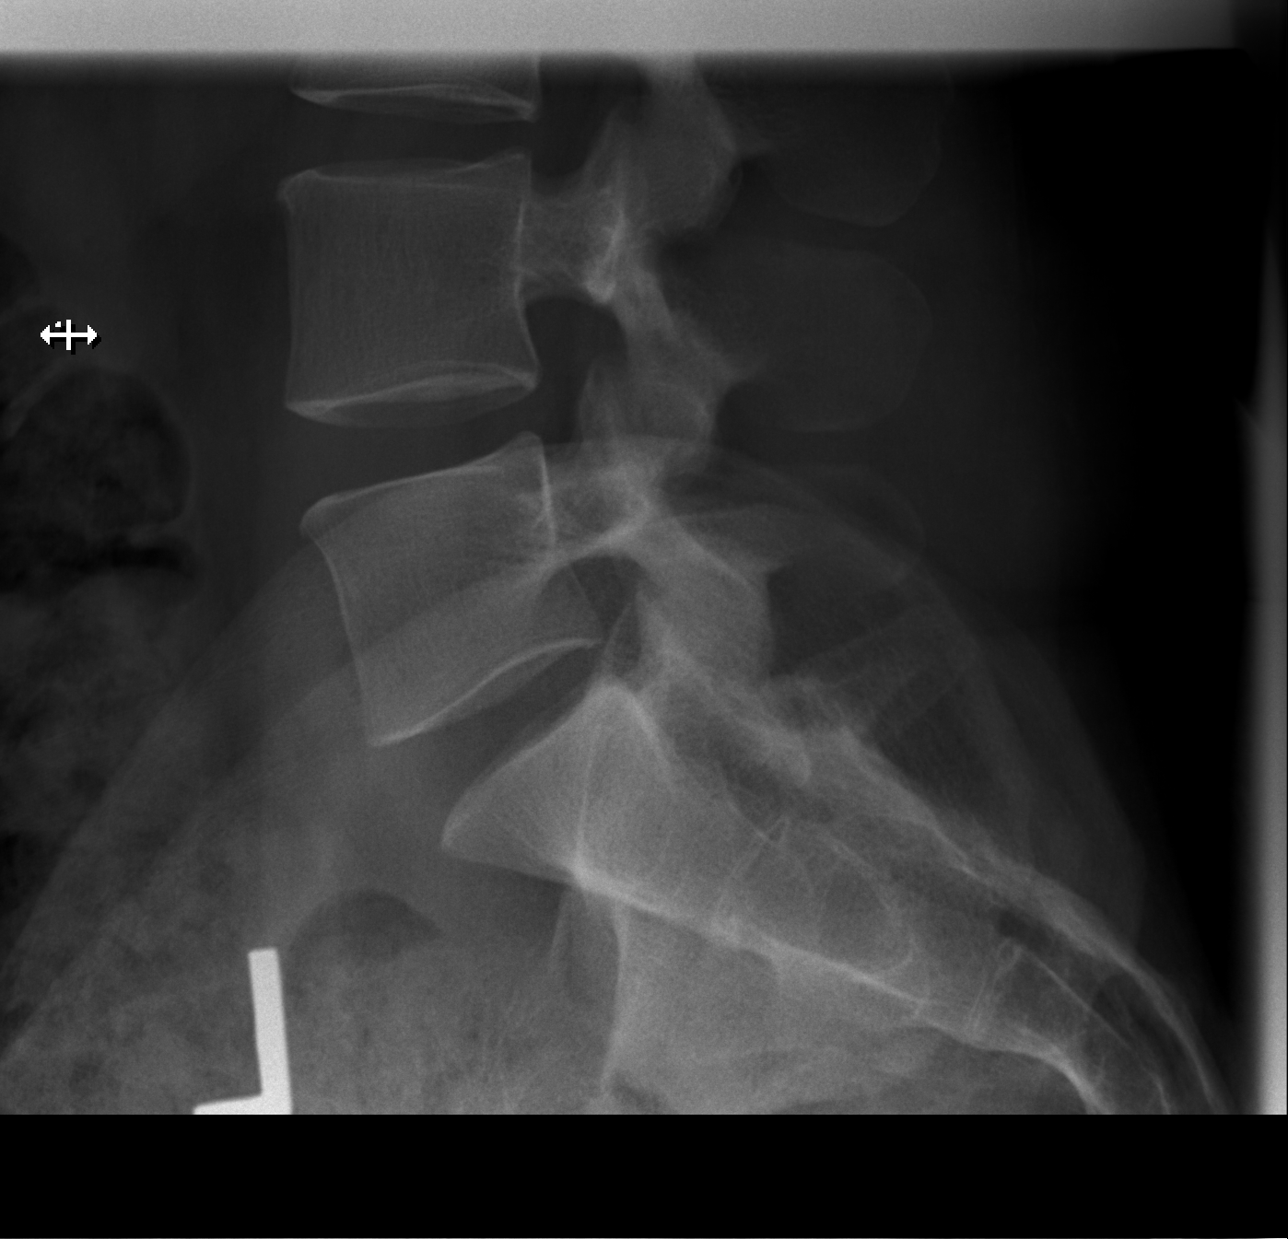

[5 of 5 positions shown; findings below may reference images not displayed]

FINDINGS: Normal alignment. Vertebral body heights are maintained without
evidence of fracture. Intervertebral disc spaces are maintained.
Intact posterior elements. Visualized portions of the pelvis are
unremarkable. SI joints are open. Nonobstructive bowel gas pattern.
IMPRESSION: Negative lumbar spine radiographs.

## 2022-01-27 IMAGING — CR DG CHEST 2V
2 series · 2 of 2 positions shown · non-contrast
Comparison: No prior.

CLINICAL DATA: Chest pain.

EXAM:
CHEST - 2 VIEW

[chest pa]
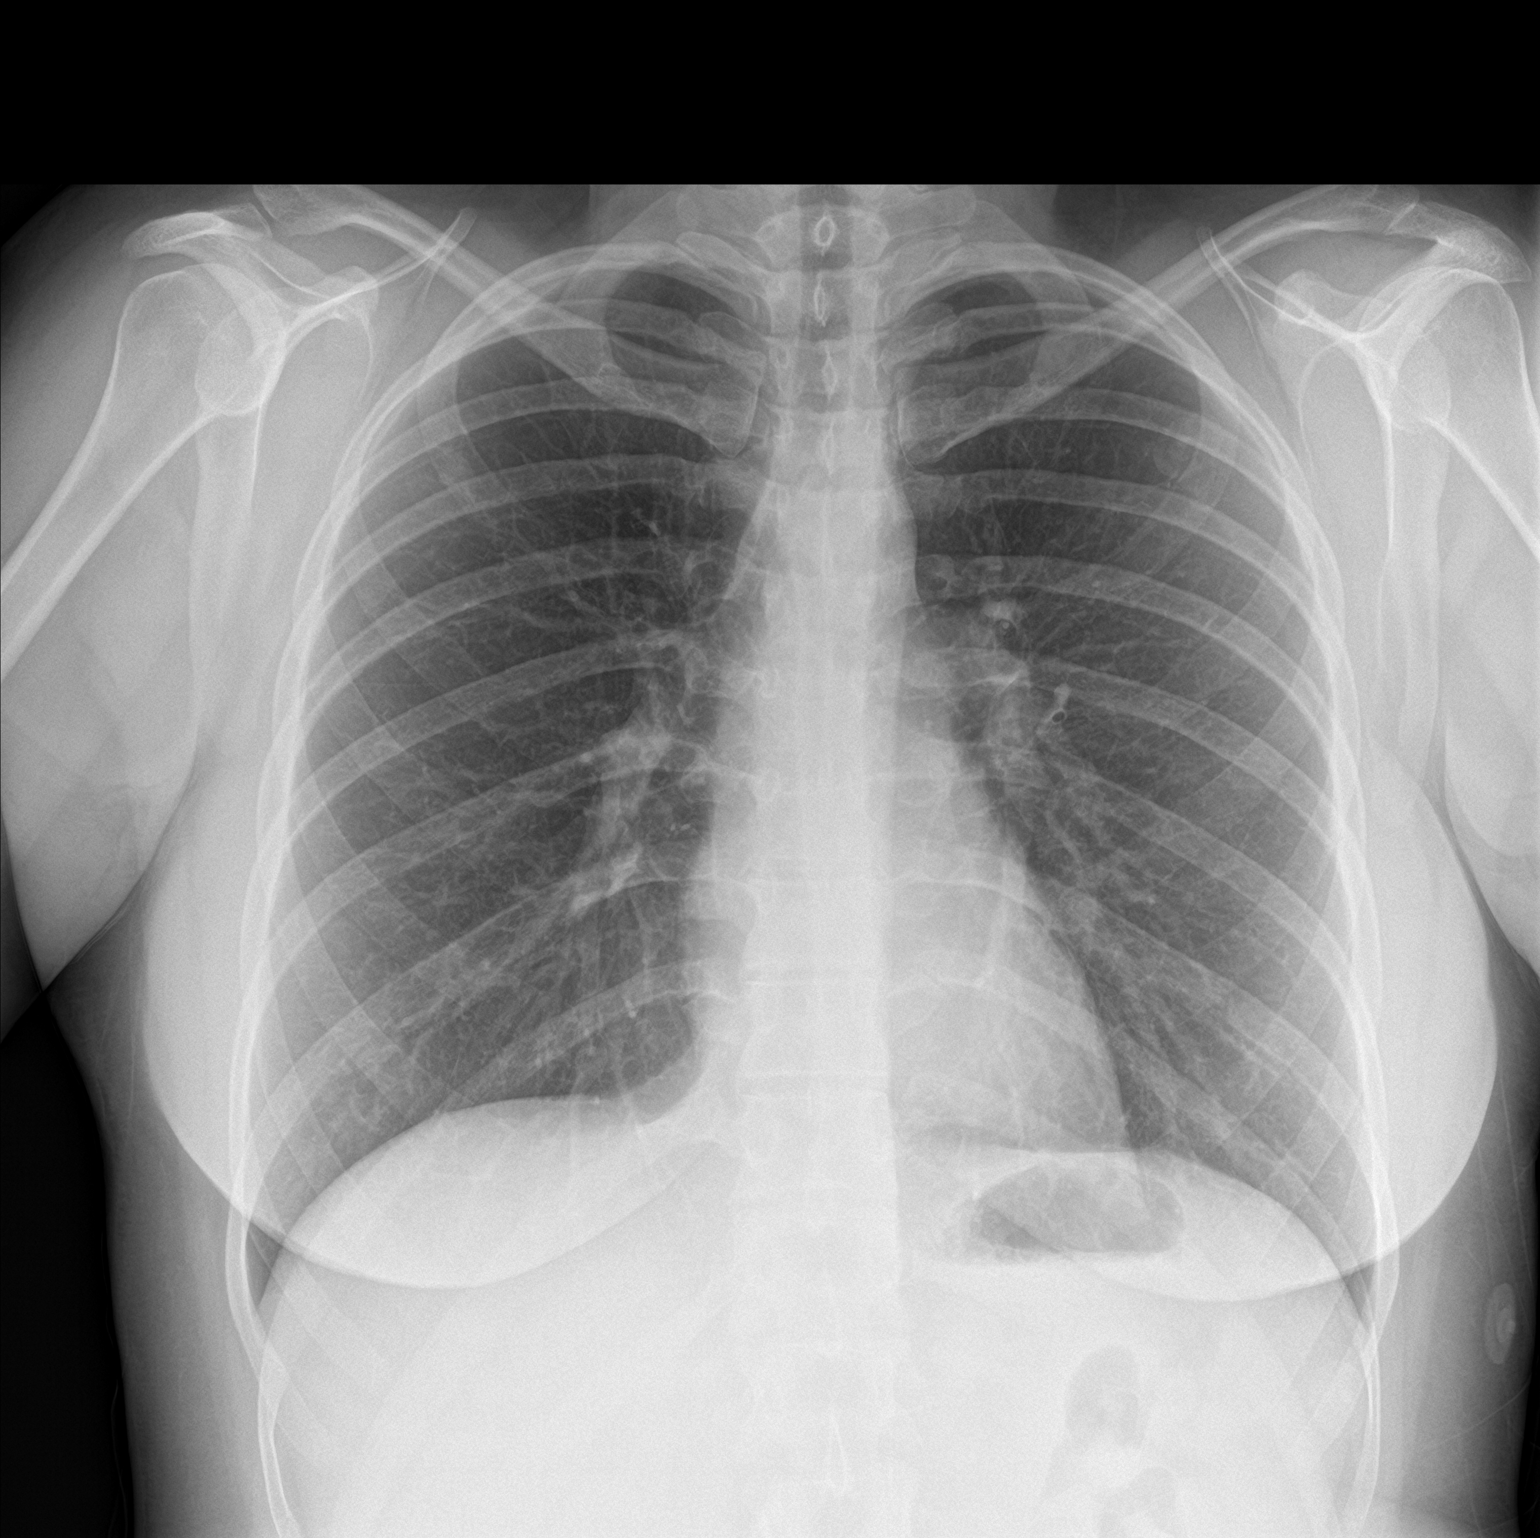

[chest lat]
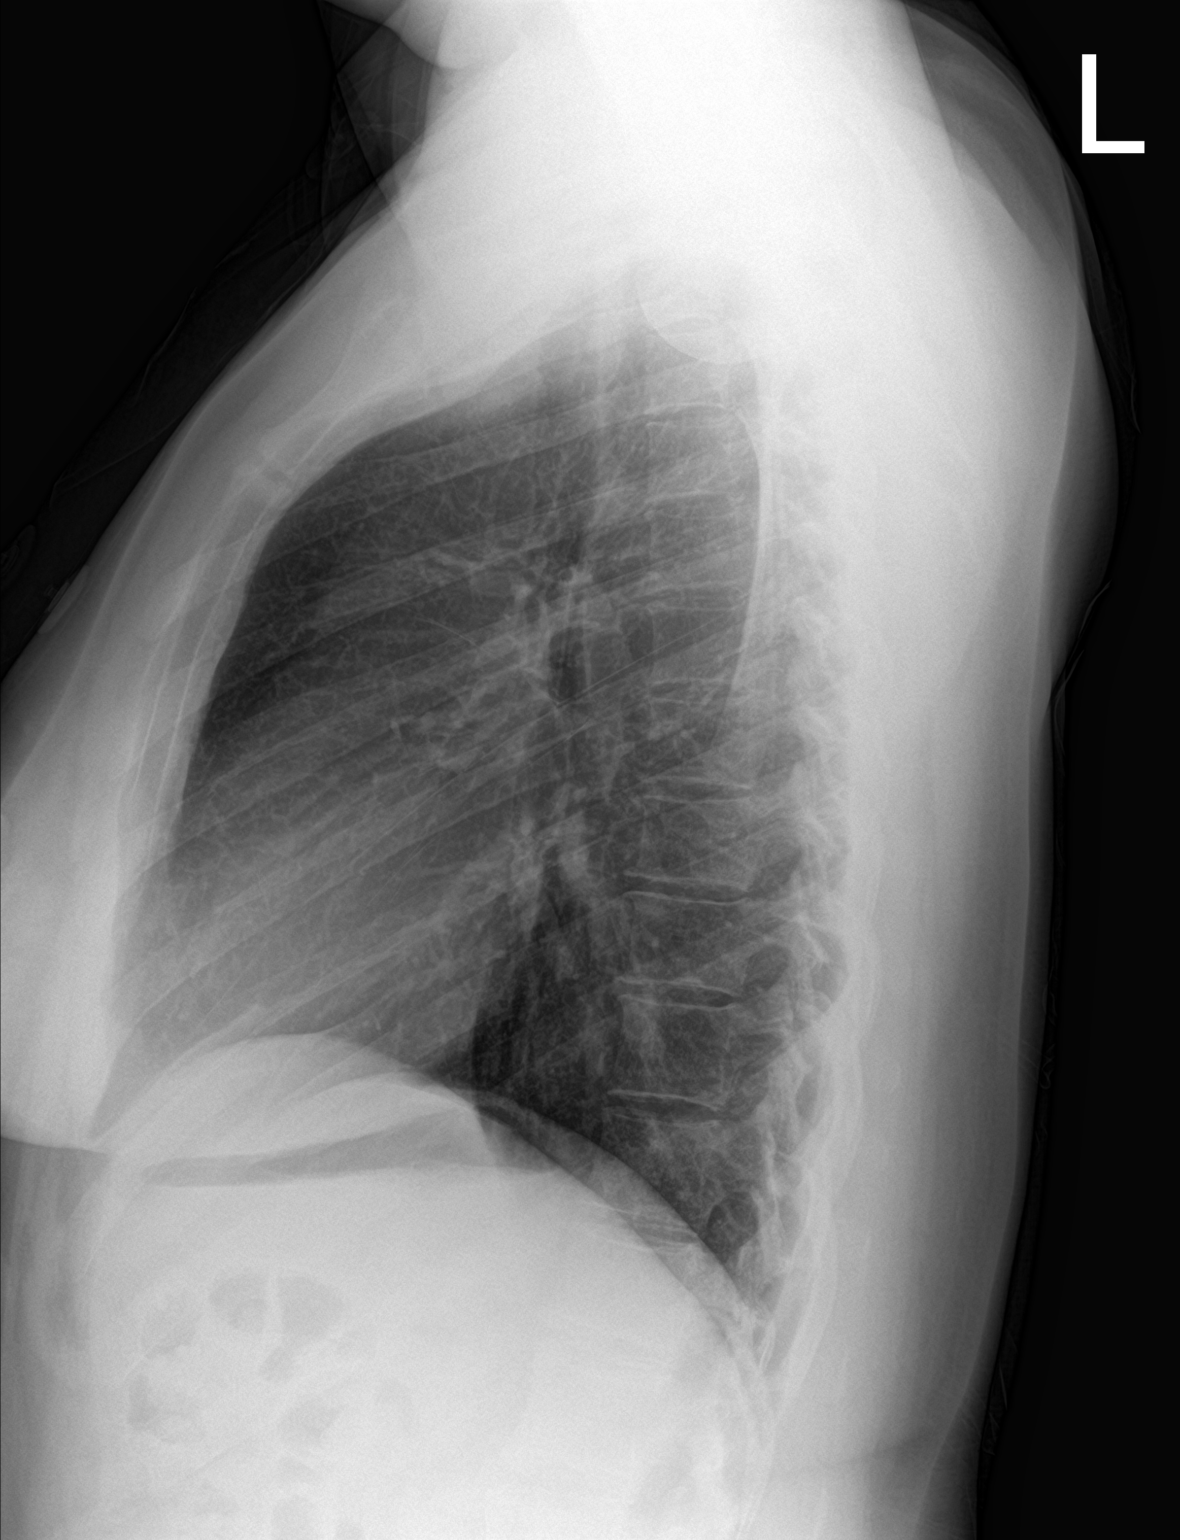

[2 of 2 positions shown; findings below may reference images not displayed]

FINDINGS: Mediastinum and hilar structures normal. Lungs are clear. No pleural
effusion or pneumothorax. Heart size normal. Thoracic spine
scoliosis and degenerative change.
IMPRESSION: No acute cardiopulmonary disease.

## 2022-03-06 ENCOUNTER — Ambulatory Visit
Admission: EM | Admit: 2022-03-06 | Discharge: 2022-03-06 | Disposition: A | Discharge status: Home / Self Care | Payer: Medicaid Other

## 2022-03-06 ENCOUNTER — Ambulatory Visit (INDEPENDENT_AMBULATORY_CARE_PROVIDER_SITE_OTHER): Payer: Medicaid Other

## 2022-03-06 ENCOUNTER — Ambulatory Visit (HOSPITAL_COMMUNITY)
Admission: EM | Admit: 2022-03-06 | Discharge: 2022-03-06 | Disposition: A | Discharge status: Home / Self Care | Payer: Medicaid Other | Attending: Internal Medicine | Admitting: Internal Medicine

## 2022-03-06 ENCOUNTER — Encounter (HOSPITAL_COMMUNITY): Payer: Self-pay

## 2022-03-06 DIAGNOSIS — M25571 Pain in right ankle and joints of right foot: Secondary | ICD-10-CM

## 2022-03-06 DIAGNOSIS — M79674 Pain in right toe(s): Secondary | ICD-10-CM

## 2022-03-06 DIAGNOSIS — S99921A Unspecified injury of right foot, initial encounter: Secondary | ICD-10-CM

## 2022-03-06 DIAGNOSIS — M79671 Pain in right foot: Secondary | ICD-10-CM

## 2022-03-06 NOTE — ED Provider Notes (Signed)
[facility]    CSN: [id number] Arrival date & time: 03/06/22  1841      History   Chief Complaint Chief Complaint  Patient presents with   Toe Pain    HPI Shannon Turner is a 37 y.o. female.   Tripped over her daughter's cat and down 3 steps. She landed on her everted right ankle. Big toe "jarred into the stair". Now having pain to the posterior right ankle and lateral inferior malleolus. Ambulating with limp favoring right  foot/ankle. No swelling or bruising. No prior injuries to R ankle.   Toe Pain    Past Medical History:  Diagnosis Date   Migraines     There are no problems to display for this patient.   Past Surgical History:  Procedure Laterality Date   TUBAL LIGATION      OB History   No obstetric history on file.      Home Medications    Prior to Admission medications   Medication Sig Start Date End Date Taking? Authorizing Provider  methocarbamol (ROBAXIN) 500 MG tablet Take 1 tablet (500 mg total) by mouth 2 (two) times daily. 12/16/18   Jacqlyn Larsen, PA-C  methylPREDNISolone (MEDROL DOSEPAK) 4 MG TBPK tablet Take as directed 12/16/18   Jacqlyn Larsen, PA-C  naproxen (NAPROSYN) 500 MG tablet Take 1 tablet (500 mg total) by mouth 2 (two) times daily. 12/16/18   Jacqlyn Larsen, PA-C  naproxen (NAPROSYN) 500 MG tablet Take 1 tablet (500 mg total) by mouth 2 (two) times daily. 04/06/19   Maudie Flakes, MD    Family History Family History  Family history unknown: Yes    Social History Social History   Tobacco Use   Smoking status: Former    Packs/day: 1.00    Types: Cigarettes   Smokeless tobacco: Never  Vaping Use   Vaping Use: Never used  Substance Use Topics   Alcohol use: No   Drug use: No     Allergies   Patient has no known allergies.   Review of Systems Review of Systems   Physical Exam Triage Vital Signs ED Triage Vitals [03/06/22 2004]  Enc Vitals Group     BP (!) 106/59     Pulse Rate 62     Resp  18     Temp 98.1 F (36.7 C)     Temp Source Oral     SpO2 98 %     Weight      Height      Head Circumference      Peak Flow      Pain Score      Pain Loc      Pain Edu?      Excl. in [place]?    No data found.  Updated Vital Signs BP (!) 106/59 (BP Location: Left Arm)   Pulse 62   Temp 98.1 F (36.7 C) (Oral)   Resp 18   LMP  (LMP Unknown)   SpO2 98%   Visual Acuity Right Eye Distance:   Left Eye Distance:   Bilateral Distance:    Right Eye Near:   Left Eye Near:    Bilateral Near:     Physical Exam   UC Treatments / Results  Labs (all labs ordered are listed, but only abnormal results are displayed) Labs Reviewed - No data to display  EKG   Radiology DG Foot Complete Right  Result Date: 03/06/2022 CLINICAL DATA:  Stubbed great toe  with pain radiating into the ankle and back of calf EXAM: RIGHT FOOT COMPLETE - 3 VIEW COMPARISON:  None Available. FINDINGS: There is no evidence of fracture or dislocation. There is no evidence of arthropathy or other focal bone abnormality. Soft tissues are unremarkable. IMPRESSION: No acute fracture or dislocation. Electronically Signed   By: Darrin Nipper M.D.   On: 03/06/2022 19:31   DG Ankle Complete Right  Result Date: 03/06/2022 CLINICAL DATA:  Injury EXAM: RIGHT ANKLE - COMPLETE 3+ VIEW COMPARISON:  None Available. FINDINGS: There is no evidence of fracture, dislocation, or joint effusion. There is no evidence of arthropathy or other focal bone abnormality. Soft tissues are unremarkable. IMPRESSION: Negative. Electronically Signed   By: Donavan Foil M.D.   On: 03/06/2022 19:28    Procedures Procedures (including critical care time)  Medications Ordered in UC Medications - No data to display  Initial Impression / Assessment and Plan / UC Course  I have reviewed the triage vital signs and the nursing notes.  Pertinent labs & imaging results that were available during my care of the patient were reviewed by me and considered in  my medical decision making (see chart for details).     *** Final Clinical Impressions(s) / UC Diagnoses   Final diagnoses:  Pain in right toe(s)     Discharge Instructions      Your x-rays of your foot were negative for fracture or dislocation. You likely sprained your toe.   Wear the shoe we provided in the clinic for the next couple of days to provide compression, stability, and comfort.  Please rest, ice, and elevate your toe to help it heal and decrease inflammation.   Take 600mg  ibuprofen every 6 hours or tylenol 1,000 every 6 hours as needed for pain. If needed, you can alternate these medications so that you take one medication every 3 hours. For instance, at noon take ibuprofen, then at 3pm take tylenol, then at 6pm take ibuprofen.   Call the orthopedic provider listed on your discharge paperwork to schedule a follow-up appointment if your symptoms do not improve in the next 1-2 weeks with supportive care.  Return to urgent care if you experience worsening pain, numbness, tingling, change of color in your skin near the injury, or any other concerning symptoms.  I hope you feel better!   ED Prescriptions   None    PDMP not reviewed this encounter.

## 2022-03-06 NOTE — Discharge Instructions (Signed)
Your x-rays of your foot were negative for fracture or dislocation. You likely sprained your toe.   Wear the shoe we provided in the clinic for the next couple of days to provide compression, stability, and comfort.  Please rest, ice, and elevate your toe to help it heal and decrease inflammation.   Take 600mg  ibuprofen every 6 hours or tylenol 1,000 every 6 hours as needed for pain. If needed, you can alternate these medications so that you take one medication every 3 hours. For instance, at noon take ibuprofen, then at 3pm take tylenol, then at 6pm take ibuprofen.   Call the orthopedic provider listed on your discharge paperwork to schedule a follow-up appointment if your symptoms do not improve in the next 1-2 weeks with supportive care.  Return to urgent care if you experience worsening pain, numbness, tingling, change of color in your skin near the injury, or any other concerning symptoms.  I hope you feel better!

## 2022-03-06 NOTE — ED Provider Notes (Signed)
Patient presents to UC at [facility] after injury to her right foot/ ankle earlier this afternoon. We do not currently have imaging capability at [facility], recommended evaluation/ imaging at [facility]- patient is agreeable with plan.    Francene Finders, PA-C 03/06/22 1820

## 2022-03-06 NOTE — ED Triage Notes (Signed)
Pt states she stubbed her right great toe on her stairs today and now has a radiating pain from ankle to back of calf when she flexes her toes.

## 2022-03-27 ENCOUNTER — Ambulatory Visit
Admission: RE | Admit: 2022-03-27 | Discharge: 2022-03-27 | Disposition: A | Discharge status: Home / Self Care | Payer: Medicaid Other | Source: Ambulatory Visit | Attending: Physician Assistant | Admitting: Physician Assistant

## 2022-03-27 VITALS — BP 114/71 | HR 96 | Temp 98.5°F | Resp 22

## 2022-03-27 DIAGNOSIS — J069 Acute upper respiratory infection, unspecified: Secondary | ICD-10-CM | POA: Diagnosis not present

## 2022-03-27 DIAGNOSIS — J111 Influenza due to unidentified influenza virus with other respiratory manifestations: Secondary | ICD-10-CM | POA: Diagnosis not present

## 2022-03-27 MED ORDER — PROMETHAZINE-DM 6.25-15 MG/5ML PO SYRP: 5.0000 mL | ORAL_SOLUTION | Freq: Four times a day (QID) | ORAL | 0 refills | Status: DC | PRN

## 2022-03-27 MED ORDER — OSELTAMIVIR PHOSPHATE 75 MG PO CAPS: 75.0000 mg | ORAL_CAPSULE | Freq: Two times a day (BID) | ORAL | 0 refills | Status: DC

## 2022-03-27 NOTE — ED Provider Notes (Signed)
[facility]    CSN: [id number] Arrival date & time: 03/27/22  0809      History   Chief Complaint Chief Complaint  Patient presents with   Fever    Fever since 6 pm yesterday Jan 21, body aches, headache since Sunday, sore itchy throat, cough and stuffy nose. - Entered by patient   Influenza    HPI Shannon Turner is a 37 y.o. female.   37 y/o Female presents with cough, fever, congestion.  Patient indicates for the past 2 days she has been having increasing upper respiratory congestion with rhinitis with mainly clear, postnasal drip, intermittent sore throat which is more scratchy.  She indicates she has also had intermittent headache, bilateral ear pressure and congestion.  She indicates she was running fever yesterday of 102.  She continues to have chest congestion with increasing cough, coughing exacerbations, production is mainly clear to yellow.  She denies wheezing or shortness of breath.  She relates she woke up yesterday having severe body aches, muscle soreness and pain, fatigue.  She indicates her daughter has flu and she has been in close contact with her for the past several days and she believes that she caught her symptoms from her daughter.  Has had mild nausea without vomiting.  She is tolerating fluids well.   Fever Associated symptoms: chills, cough and rhinorrhea   Influenza Presenting symptoms: cough, fatigue, fever and rhinorrhea   Associated symptoms: chills     Past Medical History:  Diagnosis Date   Migraines     There are no problems to display for this patient.   Past Surgical History:  Procedure Laterality Date   TUBAL LIGATION      OB History   No obstetric history on file.      Home Medications    Prior to Admission medications   Medication Sig Start Date End Date Taking? Authorizing Provider  oseltamivir (TAMIFLU) 75 MG capsule Take 1 capsule (75 mg total) by mouth every 12 (twelve) hours. 03/27/22  Yes Ellsworth Lennox,  PA-C  promethazine-dextromethorphan (PROMETHAZINE-DM) 6.25-15 MG/5ML syrup Take 5 mLs by mouth 4 (four) times daily as needed for cough. 03/27/22  Yes Ellsworth Lennox, PA-C  methocarbamol (ROBAXIN) 500 MG tablet Take 1 tablet (500 mg total) by mouth 2 (two) times daily. 12/16/18   Dartha Lodge, PA-C  methylPREDNISolone (MEDROL DOSEPAK) 4 MG TBPK tablet Take as directed 12/16/18   Dartha Lodge, PA-C  naproxen (NAPROSYN) 500 MG tablet Take 1 tablet (500 mg total) by mouth 2 (two) times daily. 12/16/18   Dartha Lodge, PA-C  naproxen (NAPROSYN) 500 MG tablet Take 1 tablet (500 mg total) by mouth 2 (two) times daily. 04/06/19   Sabas Sous, MD    Family History Family History  Family history unknown: Yes    Social History Social History   Tobacco Use   Smoking status: Former    Packs/day: 1.00    Types: Cigarettes   Smokeless tobacco: Never  Vaping Use   Vaping Use: Never used  Substance Use Topics   Alcohol use: No   Drug use: No     Allergies   Patient has no known allergies.   Review of Systems Review of Systems  Constitutional:  Positive for chills, fatigue and fever.  HENT:  Positive for postnasal drip, rhinorrhea, sinus pressure and sinus pain.   Respiratory:  Positive for cough.      Physical Exam Triage Vital Signs ED Triage Vitals  Enc Vitals Group     BP 03/27/22 0924 114/71     Pulse Rate 03/27/22 0924 96     Resp 03/27/22 0924 (!) 22     Temp 03/27/22 0924 98.5 F (36.9 C)     Temp Source 03/27/22 0924 Oral     SpO2 03/27/22 0924 98 %     Weight --      Height --      Head Circumference --      Peak Flow --      Pain Score 03/27/22 0923 4     Pain Loc --      Pain Edu? --      Excl. in [place]? --    No data found.  Updated Vital Signs BP 114/71   Pulse 96   Temp 98.5 F (36.9 C) (Oral)   Resp (!) 22   LMP  (LMP Unknown)   SpO2 98%   Visual Acuity Right Eye Distance:   Left Eye Distance:   Bilateral Distance:    Right Eye Near:    Left Eye Near:    Bilateral Near:     Physical Exam Constitutional:      Appearance: Normal appearance.  HENT:     Right Ear: Ear canal normal. Tympanic membrane is injected.     Left Ear: Ear canal normal. Tympanic membrane is injected.     Mouth/Throat:     Mouth: Mucous membranes are moist.     Pharynx: Oropharynx is clear.  Cardiovascular:     Rate and Rhythm: Normal rate and regular rhythm.     Heart sounds: Normal heart sounds.  Pulmonary:     Effort: Pulmonary effort is normal.     Breath sounds: Normal breath sounds and air entry. No wheezing, rhonchi or rales.  Lymphadenopathy:     Cervical: No cervical adenopathy.  Neurological:     Mental Status: She is alert.      UC Treatments / Results  Labs (all labs ordered are listed, but only abnormal results are displayed) Labs Reviewed - No data to display  EKG   Radiology No results found.  Procedures Procedures (including critical care time)  Medications Ordered in UC Medications - No data to display  Initial Impression / Assessment and Plan / UC Course  I have reviewed the triage vital signs and the nursing notes.  Pertinent labs & imaging results that were available during my care of the patient were reviewed by me and considered in my medical decision making (see chart for details).    Plan: The diagnosis will be treated with the following: 1.  Flu: A.  Tamiflu 75 mg every 12 hours to treat the flu. 2.  Upper respiratory tract infection: 1.  Phenergan DM, 1 teaspoon every 6 hours on a regular basis for cough and congestion. 2.  Ibuprofen or Tylenol as needed for fever, body aches and discomfort. 3.  Advised follow-up PCP return to urgent care as needed. Final Clinical Impressions(s) / UC Diagnoses   Final diagnoses:  Acute upper respiratory infection  Flu     Discharge Instructions      Advised to take the promethazine DM, 1 teaspoon every 6 hours on a regular basis for cough congestion  nausea.  (Be cautious with this medication as it does cause drowsiness) Advised take Tamiflu 75 mg every 12 hours to treat the flu.  Advised take ibuprofen 4-600 mg every 6 hours on a regular basis for fever if needed or  Tylenol.  Advised follow-up PCP or return to urgent care if symptoms fail to improve.    ED Prescriptions     Medication Sig Dispense Auth. Provider   promethazine-dextromethorphan (PROMETHAZINE-DM) 6.25-15 MG/5ML syrup Take 5 mLs by mouth 4 (four) times daily as needed for cough. 118 mL Nyoka Lint, PA-C   oseltamivir (TAMIFLU) 75 MG capsule Take 1 capsule (75 mg total) by mouth every 12 (twelve) hours. 10 capsule Nyoka Lint, PA-C      PDMP not reviewed this encounter.   Nyoka Lint, PA-C 03/27/22 [id number]

## 2022-03-27 NOTE — Discharge Instructions (Signed)
Advised to take the promethazine DM, 1 teaspoon every 6 hours on a regular basis for cough congestion nausea.  (Be cautious with this medication as it does cause drowsiness) Advised take Tamiflu 75 mg every 12 hours to treat the flu.  Advised take ibuprofen 4-600 mg every 6 hours on a regular basis for fever if needed or Tylenol.  Advised follow-up PCP or return to urgent care if symptoms fail to improve.

## 2022-03-27 NOTE — ED Triage Notes (Addendum)
Pt presents to uc with co of fatigue, ha and congestion, dizziness, body aches, nausea and vomiting. Pt reports he daughter is pos for the flu and she thinks she has caught it. Symptoms started Sunday has been taking motrin

## 2022-07-16 ENCOUNTER — Encounter: Payer: Self-pay | Admitting: Neurology

## 2022-07-16 ENCOUNTER — Ambulatory Visit: Payer: Medicaid Other | Admitting: Neurology

## 2022-07-16 VITALS — BP 110/72 | HR 86 | Ht 69.0 in | Wt 196.5 lb

## 2022-07-16 DIAGNOSIS — G43711 Chronic migraine without aura, intractable, with status migrainosus: Secondary | ICD-10-CM

## 2022-07-16 MED ORDER — AMITRIPTYLINE HCL 25 MG PO TABS: 25.0000 mg | ORAL_TABLET | Freq: Every day | ORAL | 3 refills | Status: AC

## 2022-07-16 MED ORDER — SUMATRIPTAN SUCCINATE 50 MG PO TABS: 50.0000 mg | ORAL_TABLET | ORAL | 3 refills | Status: AC | PRN

## 2022-07-16 MED ORDER — BUTALBITAL-APAP-CAFFEINE 50-325-40 MG PO TABS: 1.0000 | ORAL_TABLET | Freq: Four times a day (QID) | ORAL | 0 refills | Status: AC | PRN

## 2022-07-16 NOTE — Patient Instructions (Addendum)
Start Nurtec every other day as preventive medication  Will also start patient on Amitriptyline 25 mg nightly  10 tablets of Fioricet to help with her current status migrainosus (No refills in the future)  Imitrex as needed for severe headaches  Follow up in 6 months or sooner if worse

## 2022-07-16 NOTE — Progress Notes (Signed)
[facility]  PATIENT: Shannon Turner DOB: [birth year]  REQUESTING CLINICIAN: Jamey Reas, P* HISTORY FROM: Patient/Chart review  REASON FOR VISIT: Headaches for the past 3 months    HISTORICAL  CHIEF COMPLAINT:  Chief Complaint  Patient presents with   New Patient (Initial Visit)    Rm 12, sister present  Migraine: nonstop one since march     HISTORY OF PRESENT ILLNESS:  This is a 37 year old woman past medical history of ovarian cancer status post total hysterectomy 2 years ago, migraine headaches, anxiety/depression who is presenting with complaint of constant headache for the past 3 months.  Patient reports of long history of migraine since the age of 85, migraine is associated with photophobia, phonophobia, she has tried multiple medications in the past including gabapentin, Prozac, topiramate.  She reported 2 years ago she was diagnosed with ovarian cancer and have a total hysterectomy; following her hysterectomy she was headache free until this year in March when she started having headaches.  She did follow-up with her PCP, started on Topamax increased to 100 mg nightly with no relief in her migraine, she was prescribed Ubrelvy but not covered by insurance, feel like her headaches are nonstop, she wakes up with a headache and go to sleep with a headache.  She does have sensitivity to light and to the sun. She also completed a short course of steroids, no relief. She did have a head CT and was told that it was normal (report not available for review) and PCP is working to get her a MRI Brain.  She also has a history of anxiety/Depression, had tried Prozac, Buspar, Wellbutrin, Xanax and Klonopin.     Headache History and Characteristics: Onset:  At the age of 48  Location: Frontal  Quality:  throbbing, pounding, sometimes dull stabbing headaches Intensity:3-10 /10.  Duration: This episode has been continuous since [place]  Migrainous Features:  Photophobia, phonophobia, nausea, vomiting.  Aura: No  History of brain injury or tumor: No  Family history: Denies  Motion sickness: no Cardiac history: no  OTC: tylenol, codeine Caffeine: Yes  Sleep: Better Mood/ Stress: Better   Prior prophylaxis: Propranolol: No  Verapamil:No TCA: No Topamax: Yes Depakote: No Effexor: No but tried Prozac  Cymbalta: No Neurontin:Yes  Prior abortives: Triptan: No Anti-emetic: No Steroids: No Ergotamine suppository: No    OTHER MEDICAL CONDITIONS: Migreaines, Ovarian cancer, Depression/Anxiety    REVIEW OF SYSTEMS: Full 14 system review of systems performed and negative with exception of: As noted in the HPI   ALLERGIES: No Known Allergies  HOME MEDICATIONS: Outpatient Medications Prior to Visit  Medication Sig Dispense Refill   Vitamin D, Ergocalciferol, (DRISDOL) 1.25 MG (50000 UNIT) CAPS capsule Take 50,000 Units by mouth once a week.     topiramate (TOPAMAX) 25 MG tablet Take 25 mg by mouth as needed.     methocarbamol (ROBAXIN) 500 MG tablet Take 1 tablet (500 mg total) by mouth 2 (two) times daily. 20 tablet 0   methylPREDNISolone (MEDROL DOSEPAK) 4 MG TBPK tablet Take as directed 21 tablet 0   naproxen (NAPROSYN) 500 MG tablet Take 1 tablet (500 mg total) by mouth 2 (two) times daily. 30 tablet 0   naproxen (NAPROSYN) 500 MG tablet Take 1 tablet (500 mg total) by mouth 2 (two) times daily. 30 tablet 0   oseltamivir (TAMIFLU) 75 MG capsule Take 1 capsule (75 mg total) by mouth every 12 (twelve) hours. 10 capsule 0   promethazine-dextromethorphan (PROMETHAZINE-DM) 6.25-15  MG/5ML syrup Take 5 mLs by mouth 4 (four) times daily as needed for cough. 118 mL 0   No facility-administered medications prior to visit.    PAST MEDICAL HISTORY: Past Medical History:  Diagnosis Date   Migraines     PAST SURGICAL HISTORY: Past Surgical History:  Procedure Laterality Date   TUBAL LIGATION      FAMILY HISTORY: Family History   Problem Relation Age of Onset   Depression Mother    Depression Brother    Breast cancer Maternal Grandmother    Cervical cancer Maternal Grandmother    Pancreatic cancer Maternal Grandmother    Hypertension Maternal Grandmother    Diabetes Maternal Grandmother    Occular Albinism Maternal Grandmother     SOCIAL HISTORY: Social History   Socioeconomic History   Marital status: Married    Spouse name: Not on file   Number of children: 5   Years of education: Not on file   Highest education level: Not on file  Occupational History   Not on file  Tobacco Use   Smoking status: Some Days    Packs/day: .25    Types: Cigarettes   Smokeless tobacco: Never  Vaping Use   Vaping Use: Some days  Substance and Sexual Activity   Alcohol use: Yes    Alcohol/week: 1.0 standard drink of alcohol    Types: 1 Standard drinks or equivalent per week   Drug use: No   Sexual activity: Yes    Birth control/protection: None  Other Topics Concern   Not on file  Social History Narrative   Not on file   Social Determinants of Health   Financial Resource Strain: Not on file  Food Insecurity: Not on file  Transportation Needs: Not on file  Physical Activity: Not on file  Stress: Not on file  Social Connections: Not on file  Intimate Partner Violence: Not on file    PHYSICAL EXAM  GENERAL EXAM/CONSTITUTIONAL: Vitals:  Vitals:   07/16/22 0817  BP: 110/72  Pulse: 86  Weight: 196 lb 8 oz (89.1 kg)  Height: 5\' 9"  (1.753 m)   Body mass index is 29.02 kg/m. Wt Readings from Last 3 Encounters:  07/16/22 196 lb 8 oz (89.1 kg)  10/06/19 190 lb (86.2 kg)  04/06/19 197 lb (89.4 kg)   Patient is in no distress; well developed, nourished and groomed; neck is supple  MUSCULOSKELETAL: Gait, strength, tone, movements noted in Neurologic exam below  NEUROLOGIC: MENTAL STATUS:      No data to display         awake, alert, oriented to person, place and time recent and remote memory  intact normal attention and concentration language fluent, comprehension intact, naming intact fund of knowledge appropriate  CRANIAL NERVE:  2nd - no papilledema or hemorrhages on fundoscopic exam 2nd, 3rd, 4th, 6th - pupils equal and reactive to light, visual fields full to confrontation, extraocular muscles intact, no nystagmus 5th - facial sensation symmetric 7th - facial strength symmetric 8th - hearing intact 9th - palate elevates symmetrically, uvula midline 11th - shoulder shrug symmetric 12th - tongue protrusion midline  MOTOR:  normal bulk and tone, full strength in the BUE, BLE  SENSORY:  normal and symmetric to light touch, pinprick  COORDINATION:  finger-nose-finger, fine finger movements normal  GAIT/STATION:  normal     DIAGNOSTIC DATA (LABS, IMAGING, TESTING) - I reviewed patient records, labs, notes, testing and imaging myself where available.  Lab Results  Component Value Date  WBC 5.5 10/22/2019   HGB 13.7 10/22/2019   HCT 42.0 10/22/2019   MCV 85.4 10/22/2019   PLT 255 10/22/2019      Component Value Date/Time   NA 138 10/22/2019 1300   K 3.7 10/22/2019 1300   CL 106 10/22/2019 1300   CO2 21 (L) 10/22/2019 1300   GLUCOSE 120 (H) 10/22/2019 1300   BUN 10 10/22/2019 1300   CREATININE 0.69 10/22/2019 1300   CALCIUM 9.5 10/22/2019 1300   PROT 6.8 10/06/2019 1800   ALBUMIN 4.1 10/06/2019 1800   AST 15 10/06/2019 1800   ALT 14 10/06/2019 1800   ALKPHOS 74 10/06/2019 1800   BILITOT 0.9 10/06/2019 1800   GFRNONAA >60 10/22/2019 1300   GFRAA >60 10/22/2019 1300   No results found for: "CHOL", "HDL", "LDLCALC", "LDLDIRECT", "TRIG", "CHOLHDL" No results found for: "HGBA1C" No results found for: "VITAMINB12" No results found for: "TSH"  Reported normal head CT, report not available for review     ASSESSMENT AND PLAN  37 y.o. year old female with history of migraine headaches, anxiety/depression who is presenting with an episode of status  migrainosus since March, ongoing headaches.  She has tried multiple medications including steroid without any relief.  Plan will be for patient to start both amitriptyline and Nurtec as preventive medicine, and I will also give her Fioricet (only 10 tablets) and Imitrex to use as abortive medication.  She reports her PCP is working on getting her MRI as her CT scan was reported as normal.  I will also give her sample of Nurtec.  I will see her in 6 months for follow-up or sooner if worse.   1. Intractable chronic migraine without aura and with status migrainosus     Patient Instructions  Start Nurtec every other day as preventive medication  Will also start patient on Amitriptyline 25 mg nightly  10 tablets of Fioricet to help with her current status migrainosus (No refills in the future)  Imitrex as needed for severe headaches  Follow up in 6 months or sooner if worse      No orders of the defined types were placed in this encounter.   Meds ordered this encounter  Medications   butalbital-acetaminophen-caffeine (FIORICET) 50-325-40 MG tablet    Sig: Take 1 tablet by mouth every 6 (six) hours as needed for headache.    Dispense:  10 tablet    Refill:  0   amitriptyline (ELAVIL) 25 MG tablet    Sig: Take 1 tablet (25 mg total) by mouth at bedtime.    Dispense:  30 tablet    Refill:  3   SUMAtriptan (IMITREX) 50 MG tablet    Sig: Take 1 tablet (50 mg total) by mouth every 2 (two) hours as needed for migraine. May repeat in 2 hours if headache persists or recurs.    Dispense:  12 tablet    Refill:  3    Return in about 6 months (around 01/16/2023).  I have spent a total of 60 minutes dedicated to this patient today, preparing to see patient, performing a medically appropriate examination and evaluation, ordering tests and/or medications and procedures, and counseling and educating the patient/family/caregiver; independently interpreting result and communicating results to the  family/patient/caregiver; and documenting clinical information in the electronic medical record.   Windell Norfolk, MD 07/16/2022, 1:09 PM  [facility] [address], [address] [place], [place] [address] [phone]     \

## 2022-12-20 ENCOUNTER — Ambulatory Visit: Payer: Medicaid Other

## 2022-12-20 ENCOUNTER — Ambulatory Visit
Admission: EM | Admit: 2022-12-20 | Discharge: 2022-12-20 | Disposition: A | Discharge status: Home / Self Care | Payer: Medicaid Other | Attending: Family Medicine | Admitting: Family Medicine

## 2022-12-20 ENCOUNTER — Encounter: Payer: Self-pay | Admitting: Emergency Medicine

## 2022-12-20 DIAGNOSIS — R509 Fever, unspecified: Secondary | ICD-10-CM | POA: Insufficient documentation

## 2022-12-20 DIAGNOSIS — R051 Acute cough: Secondary | ICD-10-CM

## 2022-12-20 DIAGNOSIS — Z1152 Encounter for screening for COVID-19: Secondary | ICD-10-CM | POA: Diagnosis not present

## 2022-12-20 LAB — POCT INFLUENZA A/B
Influenza A, POC: NEGATIVE
Influenza B, POC: NEGATIVE

## 2022-12-20 NOTE — Discharge Instructions (Addendum)
You were seen today for upper respiratory symptoms.  Your flu swab was negative.  The covid swab is pending and will be resulted tomorrow.  You will be called if positive to discuss treatment.  Your chest xray appears normal.  If the radiologist read is different we will call and notify you.  In the mean time is likely viral.  I recommend rest and fluids.  I recommend tylenol/motrin for pain, as well as over the counter medications for cough.

## 2022-12-20 NOTE — ED Provider Notes (Signed)
[facility]    CSN: [id number] Arrival date & time: 12/20/22  8295      History   Chief Complaint Chief Complaint  Patient presents with   Cough    HPI Shannon Turner is a 37 y.o. female.    Cough Associated symptoms: ear pain, fever and sore throat    She is here with daughter today.  Mom started with right ear pain 3 days ago, then sore throat.  Then with fever several days ago.  Fever up to 102.  Also with cough, right sided chest pain/burning.  Using otc medications with some help.  No sick contacts.  No home covid testing.      Past Medical History:  Diagnosis Date   Migraines     Patient Active Problem List   Diagnosis Date Noted   S/P laparoscopic hysterectomy 05/02/2020   Cervical dysplasia 12/21/2019   Situational anxiety 12/17/2019   Moderate episode of recurrent major depressive disorder (HCC) 01/08/2019   Sciatic leg pain 01/08/2019   Smoker 01/08/2019    Past Surgical History:  Procedure Laterality Date   TUBAL LIGATION      OB History   No obstetric history on file.      Home Medications    Prior to Admission medications   Medication Sig Start Date End Date Taking? Authorizing Provider  amitriptyline (ELAVIL) 25 MG tablet Take 1 tablet (25 mg total) by mouth at bedtime. 07/16/22   Windell Norfolk, MD  butalbital-acetaminophen-caffeine (FIORICET) 50-325-40 MG tablet Take 1 tablet by mouth every 6 (six) hours as needed for headache. 07/16/22   Windell Norfolk, MD  SUMAtriptan (IMITREX) 50 MG tablet Take 1 tablet (50 mg total) by mouth every 2 (two) hours as needed for migraine. May repeat in 2 hours if headache persists or recurs. 07/16/22   Windell Norfolk, MD  Vitamin D, Ergocalciferol, (DRISDOL) 1.25 MG (50000 UNIT) CAPS capsule Take 50,000 Units by mouth once a week. 05/16/22   [provider]    Family History Family History  Problem Relation Age of Onset   Depression Mother    Depression Brother    Breast  cancer Maternal Grandmother    Cervical cancer Maternal Grandmother    Pancreatic cancer Maternal Grandmother    Hypertension Maternal Grandmother    Diabetes Maternal Grandmother    Occular Albinism Maternal Grandmother     Social History Social History   Tobacco Use   Smoking status: Some Days    Current packs/day: 0.25    Types: Cigarettes   Smokeless tobacco: Never  Vaping Use   Vaping status: Some Days  Substance Use Topics   Alcohol use: Yes    Alcohol/week: 1.0 standard drink of alcohol    Types: 1 Standard drinks or equivalent per week   Drug use: No     Allergies   Patient has no known allergies.   Review of Systems Review of Systems  Constitutional:  Positive for fatigue and fever.  HENT:  Positive for ear pain and sore throat.   Respiratory:  Positive for cough.   Cardiovascular: Negative.   Gastrointestinal: Negative.   Musculoskeletal: Negative.   Psychiatric/Behavioral: Negative.       Physical Exam Triage Vital Signs ED Triage Vitals  Encounter Vitals Group     BP 12/20/22 0949 104/69     Systolic BP Percentile --      Diastolic BP Percentile --      Pulse Rate 12/20/22 0949 62  Resp 12/20/22 0949 20     Temp 12/20/22 0949 98.2 F (36.8 C)     Temp Source 12/20/22 0949 Oral     SpO2 12/20/22 0949 97 %     Weight 12/20/22 0952 194 lb (88 kg)     Height 12/20/22 0952 5\' 9"  (1.753 m)     Head Circumference --      Peak Flow --      Pain Score 12/20/22 0951 6     Pain Loc --      Pain Education --      Exclude from Growth Chart --    No data found.  Updated Vital Signs BP 104/69 (BP Location: Left Arm)   Pulse 62   Temp 98.2 F (36.8 C) (Oral)   Resp 20   Ht 5\' 9"  (1.753 m)   Wt 88 kg   LMP  (LMP Unknown)   SpO2 97%   BMI 28.65 kg/m   Visual Acuity Right Eye Distance:   Left Eye Distance:   Bilateral Distance:    Right Eye Near:   Left Eye Near:    Bilateral Near:     Physical Exam Constitutional:      General:  She is not in acute distress.    Appearance: Normal appearance. She is normal weight. She is not ill-appearing.  HENT:     Right Ear: Tympanic membrane normal.     Left Ear: Tympanic membrane normal.     Nose: Nose normal. No congestion.     Mouth/Throat:     Mouth: Mucous membranes are moist.  Cardiovascular:     Rate and Rhythm: Normal rate and regular rhythm.  Pulmonary:     Effort: Pulmonary effort is normal.     Breath sounds: Normal breath sounds.  Musculoskeletal:     Cervical back: Normal range of motion.  Skin:    General: Skin is warm.  Neurological:     General: No focal deficit present.     Mental Status: She is alert.  Psychiatric:        Mood and Affect: Mood normal.      UC Treatments / Results  Labs (all labs ordered are listed, but only abnormal results are displayed) Labs Reviewed  SARS CORONAVIRUS 2 (TAT 6-24 HRS)  POCT INFLUENZA A/B   Flu negative  EKG   Radiology No results found.  Procedures Procedures (including critical care time)  Medications Ordered in UC Medications - No data to display  Initial Impression / Assessment and Plan / UC Course  I have reviewed the triage vital signs and the nursing notes.  Pertinent labs & imaging results that were available during my care of the patient were reviewed by me and considered in my medical decision making (see chart for details).  Final Clinical Impressions(s) / UC Diagnoses   Final diagnoses:  Acute cough  Fever, unspecified  Encounter for screening for COVID-19     Discharge Instructions      You were seen today for upper respiratory symptoms.  Your flu swab was negative.  The covid swab is pending and will be resulted tomorrow.  You will be called if positive to discuss treatment.  Your chest xray appears normal.  If the radiologist read is different we will call and notify you.  In the mean time is likely viral.  I recommend rest and fluids.  I recommend tylenol/motrin for  pain, as well as over the counter medications for cough.  ED Prescriptions   None    PDMP not reviewed this encounter.   Jannifer Franklin, MD 12/20/22 1056

## 2022-12-20 NOTE — ED Triage Notes (Signed)
Patient c/o cough, fever, right sided sore throat x 2 days.  Fever worse at night.  Patient is rotating Mucinex, Ibuprofen and Tylenol.

## 2022-12-21 LAB — SARS CORONAVIRUS 2 (TAT 6-24 HRS): SARS Coronavirus 2: NEGATIVE

## 2023-01-22 ENCOUNTER — Ambulatory Visit: Payer: Medicaid Other | Admitting: Neurology

## 2023-01-22 ENCOUNTER — Encounter: Payer: Self-pay | Admitting: Neurology

## 2023-04-10 ENCOUNTER — Ambulatory Visit
Admission: EM | Admit: 2023-04-10 | Discharge: 2023-04-10 | Disposition: A | Discharge status: Home / Self Care | Payer: Medicaid Other

## 2023-04-10 DIAGNOSIS — U071 COVID-19: Secondary | ICD-10-CM | POA: Diagnosis not present

## 2023-04-10 LAB — POCT INFLUENZA A/B
Influenza A, POC: NEGATIVE
Influenza B, POC: NEGATIVE

## 2023-04-10 NOTE — ED Triage Notes (Signed)
"  I did an at home test for COVID19 and it was + today, 2 of my kids tested + for Flu A on Friday". "Since yesterday with Cough (making my chest hurt just after for a while), No fever, No sob and now Ha with body aches".

## 2023-04-10 NOTE — ED Provider Notes (Signed)
 [facility]    CSN: [id number] Arrival date & time: 04/10/23  1543      History   Chief Complaint Chief Complaint  Patient presents with   Cough    HPI Shannon Turner is a 38 y.o. female.   Patient here today for evaluation of headache, body aches and cough.  She reports symptoms started yesterday.  She notes that 2 of her children tested positive for flu several days ago and that she tested positive for COVID today.  She has not any vomiting or diarrhea.  She has taken over-the-counter medication without resolution.  The history is provided by the patient.  Cough Associated symptoms: chills, fever, headaches, myalgias and sore throat   Associated symptoms: no ear pain, no eye discharge, no shortness of breath and no wheezing     Past Medical History:  Diagnosis Date   Migraines     Patient Active Problem List   Diagnosis Date Noted   S/P laparoscopic hysterectomy 05/02/2020   Cervical dysplasia 12/21/2019   Situational anxiety 12/17/2019   Moderate episode of recurrent major depressive disorder (HCC) 01/08/2019   Sciatic leg pain 01/08/2019   Smoker 01/08/2019    Past Surgical History:  Procedure Laterality Date   TUBAL LIGATION      OB History   No obstetric history on file.      Home Medications    Prior to Admission medications   Medication Sig Start Date End Date Taking? Authorizing Provider  divalproex (DEPAKOTE ER) 500 MG 24 hr tablet Take 1,000 mg by mouth at bedtime. 04/04/23   [provider]  escitalopram (LEXAPRO) 5 MG tablet Take 5 mg by mouth daily. 04/09/23   [provider]  LORazepam (ATIVAN) 1 MG tablet Take 1 mg by mouth daily as needed. 04/09/23   [provider]  lurasidone (LATUDA) 40 MG TABS tablet Take 40 mg by mouth daily with breakfast. 11/22/22   [provider]  Lurasidone HCl 60 MG TABS Take 1 tablet by mouth daily. 04/09/23   [provider]  risperiDONE (RISPERDAL) 1 MG  tablet Take 1 mg by mouth at bedtime. 04/04/23   [provider]  amitriptyline  (ELAVIL ) 25 MG tablet Take 1 tablet (25 mg total) by mouth at bedtime. 07/16/22   Camara, Amadou, MD  butalbital -acetaminophen-caffeine  (FIORICET) 50-325-40 MG tablet Take 1 tablet by mouth every 6 (six) hours as needed for headache. 07/16/22   Gregg Lek, MD  SUMAtriptan  (IMITREX ) 50 MG tablet Take 1 tablet (50 mg total) by mouth every 2 (two) hours as needed for migraine. May repeat in 2 hours if headache persists or recurs. 07/16/22   Camara, Amadou, MD  Vitamin D, Ergocalciferol, (DRISDOL) 1.25 MG (50000 UNIT) CAPS capsule Take 50,000 Units by mouth once a week. 05/16/22   [provider]    Family History Family History  Problem Relation Age of Onset   Depression Mother    Depression Brother    Breast cancer Maternal Grandmother    Cervical cancer Maternal Grandmother    Pancreatic cancer Maternal Grandmother    Hypertension Maternal Grandmother    Diabetes Maternal Grandmother    Occular Albinism Maternal Grandmother     Social History Social History   Tobacco Use   Smoking status: Former    Current packs/day: 0.25    Types: Cigarettes   Smokeless tobacco: Never  Vaping Use   Vaping status: Former   Substances: Nicotine, Flavoring  Substance Use Topics   Alcohol  use: Not Currently    Alcohol/week: 1.0 standard drink of alcohol    Types: 1 Standard drinks or equivalent per week    Comment: Occassionally.   Drug use: No     Allergies   Patient has no known allergies.   Review of Systems Review of Systems  Constitutional:  Positive for chills and fever.  HENT:  Positive for congestion and sore throat. Negative for ear pain.   Eyes:  Negative for discharge and redness.  Respiratory:  Positive for cough. Negative for shortness of breath and wheezing.   Gastrointestinal:  Negative for abdominal pain, diarrhea, nausea and vomiting.  Musculoskeletal:  Positive for  myalgias.  Neurological:  Positive for headaches.     Physical Exam Triage Vital Signs ED Triage Vitals  Encounter Vitals Group     BP 04/10/23 1607 107/69     Systolic BP Percentile --      Diastolic BP Percentile --      Pulse Rate 04/10/23 1607 76     Resp 04/10/23 1607 18     Temp 04/10/23 1607 98.1 F (36.7 C)     Temp Source 04/10/23 1607 Oral     SpO2 04/10/23 1607 99 %     Weight 04/10/23 1605 199 lb (90.3 kg)     Height 04/10/23 1605 5' 9 (1.753 m)     Head Circumference --      Peak Flow --      Pain Score 04/10/23 1602 0     Pain Loc --      Pain Education --      Exclude from Growth Chart --    No data found.  Updated Vital Signs BP 107/69 (BP Location: Left Arm)   Pulse 76   Temp 98.1 F (36.7 C) (Oral)   Resp 18   Ht 5' 9 (1.753 m)   Wt 199 lb (90.3 kg)   LMP  (LMP Unknown)   SpO2 99%   BMI 29.39 kg/m   Visual Acuity Right Eye Distance:   Left Eye Distance:   Bilateral Distance:    Right Eye Near:   Left Eye Near:    Bilateral Near:     Physical Exam Vitals and nursing note reviewed.  Constitutional:      General: She is not in acute distress.    Appearance: Normal appearance. She is not ill-appearing.  HENT:     Head: Normocephalic and atraumatic.     Right Ear: Tympanic membrane normal.     Left Ear: Tympanic membrane normal.     Nose: Congestion present.     Mouth/Throat:     Mouth: Mucous membranes are moist.     Pharynx: No oropharyngeal exudate or posterior oropharyngeal erythema.  Eyes:     Conjunctiva/sclera: Conjunctivae normal.  Cardiovascular:     Rate and Rhythm: Normal rate and regular rhythm.     Heart sounds: Normal heart sounds. No murmur heard. Pulmonary:     Effort: Pulmonary effort is normal. No respiratory distress.     Breath sounds: Normal breath sounds. No wheezing, rhonchi or rales.  Skin:    General: Skin is warm and dry.  Neurological:     Mental Status: She is alert.  Psychiatric:        Mood and  Affect: Mood normal.        Thought Content: Thought content normal.      UC Treatments / Results  Labs (all labs ordered are listed, but only abnormal  results are displayed) Labs Reviewed  POCT INFLUENZA A/B - Normal    EKG   Radiology No results found.  Procedures Procedures (including critical care time)  Medications Ordered in UC Medications - No data to display  Initial Impression / Assessment and Plan / UC Course  I have reviewed the triage vital signs and the nursing notes.  Pertinent labs & imaging results that were available during my care of the patient were reviewed by me and considered in my medical decision making (see chart for details).    Flu screening negative.  Offered Paxlovid but patient declines as this is made symptoms worse in the past.  Advised symptomatic treatment, increase fluids and rest with follow-up if symptoms fail to improve or worsen.  Final Clinical Impressions(s) / UC Diagnoses   Final diagnoses:  COVID-19   Discharge Instructions   None    ED Prescriptions   None    PDMP not reviewed this encounter.   Billy Asberry FALCON, PA-C 04/10/23 1718
